# Patient Record
Sex: Female | Born: 1960 | Race: Black or African American | Hispanic: No | Marital: Single | Smoking: Never smoker
Health system: Southern US, Community
[De-identification: ages and names within clinical notes are randomized; demographics above are authoritative.]

## PROBLEM LIST (undated history)

## (undated) DIAGNOSIS — T7840XA Allergy, unspecified, initial encounter: Secondary | ICD-10-CM

## (undated) DIAGNOSIS — F419 Anxiety disorder, unspecified: Secondary | ICD-10-CM

## (undated) DIAGNOSIS — R51 Headache: Secondary | ICD-10-CM

## (undated) HISTORY — PX: WRIST SURGERY: SHX841

## (undated) HISTORY — DX: Allergy, unspecified, initial encounter: T78.40XA

## (undated) HISTORY — PX: ABDOMINAL HYSTERECTOMY: SHX81

## (undated) HISTORY — DX: Anxiety disorder, unspecified: F41.9

## (undated) HISTORY — DX: Headache: R51

---

## 1998-10-27 ENCOUNTER — Other Ambulatory Visit: Admission: RE | Admit: 1998-10-27 | Discharge: 1998-10-27 | Payer: Self-pay | Admitting: Obstetrics and Gynecology

## 1999-02-14 ENCOUNTER — Other Ambulatory Visit: Admission: RE | Admit: 1999-02-14 | Discharge: 1999-02-14 | Payer: Self-pay | Admitting: Obstetrics and Gynecology

## 1999-09-03 ENCOUNTER — Other Ambulatory Visit: Admission: RE | Admit: 1999-09-03 | Discharge: 1999-09-03 | Payer: Self-pay | Admitting: Obstetrics and Gynecology

## 1999-11-12 ENCOUNTER — Other Ambulatory Visit: Admission: RE | Admit: 1999-11-12 | Discharge: 1999-11-12 | Payer: Self-pay | Admitting: Obstetrics and Gynecology

## 1999-11-26 ENCOUNTER — Ambulatory Visit (HOSPITAL_COMMUNITY): Admission: RE | Admit: 1999-11-26 | Discharge: 1999-11-26 | Payer: Self-pay | Admitting: General Surgery

## 2000-12-13 ENCOUNTER — Other Ambulatory Visit: Admission: RE | Admit: 2000-12-13 | Discharge: 2000-12-13 | Payer: Self-pay | Admitting: Obstetrics and Gynecology

## 2001-05-08 ENCOUNTER — Encounter (INDEPENDENT_AMBULATORY_CARE_PROVIDER_SITE_OTHER): Payer: Self-pay | Admitting: Specialist

## 2001-05-08 ENCOUNTER — Ambulatory Visit (HOSPITAL_COMMUNITY): Admission: RE | Admit: 2001-05-08 | Discharge: 2001-05-08 | Payer: Self-pay | Admitting: Obstetrics and Gynecology

## 2001-06-06 HISTORY — PX: ABDOMINAL HYSTERECTOMY: SHX81

## 2001-11-08 ENCOUNTER — Inpatient Hospital Stay (HOSPITAL_COMMUNITY): Admission: RE | Admit: 2001-11-08 | Discharge: 2001-11-11 | Payer: Self-pay | Admitting: Obstetrics and Gynecology

## 2001-11-08 ENCOUNTER — Encounter (INDEPENDENT_AMBULATORY_CARE_PROVIDER_SITE_OTHER): Payer: Self-pay

## 2003-09-19 ENCOUNTER — Encounter: Admission: RE | Admit: 2003-09-19 | Discharge: 2003-09-19 | Payer: Self-pay | Admitting: Family Medicine

## 2004-01-20 ENCOUNTER — Ambulatory Visit (HOSPITAL_BASED_OUTPATIENT_CLINIC_OR_DEPARTMENT_OTHER): Admission: RE | Admit: 2004-01-20 | Discharge: 2004-01-20 | Payer: Self-pay | Admitting: Orthopaedic Surgery

## 2004-08-16 ENCOUNTER — Ambulatory Visit: Payer: Self-pay | Admitting: Family Medicine

## 2004-08-18 ENCOUNTER — Encounter: Admission: RE | Admit: 2004-08-18 | Discharge: 2004-08-18 | Payer: Self-pay | Admitting: Family Medicine

## 2004-08-23 ENCOUNTER — Ambulatory Visit: Payer: Self-pay | Admitting: Family Medicine

## 2004-08-25 ENCOUNTER — Ambulatory Visit (HOSPITAL_COMMUNITY): Admission: RE | Admit: 2004-08-25 | Discharge: 2004-08-25 | Payer: Self-pay | Admitting: Family Medicine

## 2004-09-22 ENCOUNTER — Ambulatory Visit: Payer: Self-pay | Admitting: Family Medicine

## 2004-10-14 ENCOUNTER — Ambulatory Visit: Payer: Self-pay | Admitting: Family Medicine

## 2005-01-24 ENCOUNTER — Ambulatory Visit: Payer: Self-pay | Admitting: Family Medicine

## 2005-01-28 ENCOUNTER — Ambulatory Visit: Payer: Self-pay | Admitting: Internal Medicine

## 2005-05-03 ENCOUNTER — Ambulatory Visit: Payer: Self-pay | Admitting: Family Medicine

## 2005-07-06 ENCOUNTER — Ambulatory Visit: Payer: Self-pay | Admitting: Family Medicine

## 2005-07-25 ENCOUNTER — Ambulatory Visit: Payer: Self-pay | Admitting: Family Medicine

## 2005-08-01 ENCOUNTER — Ambulatory Visit: Payer: Self-pay | Admitting: Family Medicine

## 2005-09-20 ENCOUNTER — Ambulatory Visit: Payer: Self-pay | Admitting: Family Medicine

## 2006-02-07 ENCOUNTER — Ambulatory Visit: Payer: Self-pay | Admitting: Internal Medicine

## 2006-04-02 ENCOUNTER — Emergency Department (HOSPITAL_COMMUNITY): Admission: EM | Admit: 2006-04-02 | Discharge: 2006-04-02 | Payer: Self-pay | Admitting: Emergency Medicine

## 2006-09-13 ENCOUNTER — Ambulatory Visit: Payer: Self-pay | Admitting: Family Medicine

## 2006-09-13 ENCOUNTER — Ambulatory Visit: Payer: Self-pay | Admitting: Internal Medicine

## 2006-09-18 ENCOUNTER — Ambulatory Visit: Payer: Self-pay | Admitting: Family Medicine

## 2006-10-19 ENCOUNTER — Ambulatory Visit: Payer: Self-pay | Admitting: Family Medicine

## 2006-10-19 LAB — CONVERTED CEMR LAB
ALT: 20 units/L (ref 0–40)
AST: 19 units/L (ref 0–37)
Albumin: 3.5 g/dL (ref 3.5–5.2)
Basophils Absolute: 0 10*3/uL (ref 0.0–0.1)
Basophils Relative: 1 % (ref 0.0–1.0)
Bilirubin, Direct: 0.1 mg/dL (ref 0.0–0.3)
Eosinophils Absolute: 0.1 10*3/uL (ref 0.0–0.6)
Glucose, Bld: 91 mg/dL (ref 70–99)
HCT: 36.1 % (ref 36.0–46.0)
Hemoglobin: 12.3 g/dL (ref 12.0–15.0)
MCHC: 34 g/dL (ref 30.0–36.0)
MCV: 90.4 fL (ref 78.0–100.0)
Neutro Abs: 1.7 10*3/uL (ref 1.4–7.7)
Neutrophils Relative %: 39.6 % — ABNORMAL LOW (ref 43.0–77.0)
Platelets: 245 10*3/uL (ref 150–400)
Potassium: 4.2 meq/L (ref 3.5–5.1)
RBC: 3.99 M/uL (ref 3.87–5.11)
Total Protein: 7 g/dL (ref 6.0–8.3)

## 2006-10-26 ENCOUNTER — Ambulatory Visit: Payer: Self-pay | Admitting: Family Medicine

## 2006-11-01 ENCOUNTER — Encounter: Admission: RE | Admit: 2006-11-01 | Discharge: 2006-11-01 | Payer: Self-pay | Admitting: Family Medicine

## 2007-01-25 ENCOUNTER — Encounter (INDEPENDENT_AMBULATORY_CARE_PROVIDER_SITE_OTHER): Payer: Self-pay | Admitting: *Deleted

## 2007-01-25 ENCOUNTER — Telehealth: Payer: Self-pay | Admitting: Family Medicine

## 2007-05-30 ENCOUNTER — Ambulatory Visit: Payer: Self-pay | Admitting: Internal Medicine

## 2007-05-30 ENCOUNTER — Telehealth: Payer: Self-pay | Admitting: Internal Medicine

## 2007-06-01 ENCOUNTER — Telehealth: Payer: Self-pay | Admitting: Family Medicine

## 2007-06-04 ENCOUNTER — Telehealth: Payer: Self-pay | Admitting: Family Medicine

## 2007-06-04 ENCOUNTER — Ambulatory Visit: Payer: Self-pay | Admitting: Family Medicine

## 2007-09-08 ENCOUNTER — Ambulatory Visit: Payer: Self-pay | Admitting: Family Medicine

## 2007-09-10 ENCOUNTER — Ambulatory Visit: Payer: Self-pay | Admitting: Family Medicine

## 2007-10-26 ENCOUNTER — Ambulatory Visit: Payer: Self-pay | Admitting: Family Medicine

## 2007-10-26 LAB — CONVERTED CEMR LAB
ALT: 15 units/L (ref 0–35)
AST: 20 units/L (ref 0–37)
Alkaline Phosphatase: 54 units/L (ref 39–117)
Basophils Absolute: 0 10*3/uL (ref 0.0–0.1)
Basophils Relative: 0.7 % (ref 0.0–1.0)
Bilirubin, Direct: 0.1 mg/dL (ref 0.0–0.3)
Calcium: 9.2 mg/dL (ref 8.4–10.5)
Eosinophils Relative: 1.7 % (ref 0.0–5.0)
Glucose, Bld: 100 mg/dL — ABNORMAL HIGH (ref 70–99)
HCT: 39 % (ref 36.0–46.0)
HDL: 42.9 mg/dL (ref >39.0)
MCHC: 32.4 g/dL (ref 30.0–36.0)
MCV: 92.1 fL (ref 78.0–100.0)
Monocytes Absolute: 0.4 10*3/uL (ref 0.1–1.0)
Monocytes Relative: 9.5 % (ref 3.0–12.0)
Neutro Abs: 1.5 10*3/uL (ref 1.4–7.7)
Triglycerides: 74 mg/dL (ref 0–149)
VLDL: 15 mg/dL (ref 0–40)

## 2007-10-31 ENCOUNTER — Telehealth (INDEPENDENT_AMBULATORY_CARE_PROVIDER_SITE_OTHER): Payer: Self-pay | Admitting: *Deleted

## 2007-10-31 ENCOUNTER — Ambulatory Visit: Payer: Self-pay | Admitting: Family Medicine

## 2007-10-31 DIAGNOSIS — R519 Headache, unspecified: Secondary | ICD-10-CM | POA: Insufficient documentation

## 2007-10-31 DIAGNOSIS — R51 Headache: Secondary | ICD-10-CM | POA: Insufficient documentation

## 2007-11-13 ENCOUNTER — Ambulatory Visit: Payer: Self-pay | Admitting: Internal Medicine

## 2007-11-27 ENCOUNTER — Ambulatory Visit: Payer: Self-pay | Admitting: Internal Medicine

## 2007-11-27 LAB — HM COLONOSCOPY

## 2007-11-29 ENCOUNTER — Encounter: Admission: RE | Admit: 2007-11-29 | Discharge: 2007-11-29 | Payer: Self-pay | Admitting: Family Medicine

## 2008-01-01 ENCOUNTER — Ambulatory Visit: Payer: Self-pay | Admitting: Internal Medicine

## 2008-01-01 ENCOUNTER — Telehealth (INDEPENDENT_AMBULATORY_CARE_PROVIDER_SITE_OTHER): Payer: Self-pay | Admitting: *Deleted

## 2008-01-17 ENCOUNTER — Telehealth: Payer: Self-pay | Admitting: Family Medicine

## 2008-02-15 DIAGNOSIS — G44009 Cluster headache syndrome, unspecified, not intractable: Secondary | ICD-10-CM | POA: Insufficient documentation

## 2008-02-18 ENCOUNTER — Ambulatory Visit: Payer: Self-pay | Admitting: Family Medicine

## 2008-02-28 ENCOUNTER — Telehealth: Payer: Self-pay | Admitting: Family Medicine

## 2008-03-05 ENCOUNTER — Emergency Department (HOSPITAL_COMMUNITY): Admission: EM | Admit: 2008-03-05 | Discharge: 2008-03-06 | Payer: Self-pay | Admitting: Emergency Medicine

## 2008-03-06 ENCOUNTER — Ambulatory Visit: Payer: Self-pay | Admitting: Family Medicine

## 2008-05-06 ENCOUNTER — Ambulatory Visit: Payer: Self-pay | Admitting: Family Medicine

## 2008-05-22 ENCOUNTER — Telehealth: Payer: Self-pay | Admitting: Family Medicine

## 2008-08-25 ENCOUNTER — Telehealth: Payer: Self-pay | Admitting: Family Medicine

## 2008-09-15 ENCOUNTER — Telehealth: Payer: Self-pay | Admitting: Family Medicine

## 2008-09-15 ENCOUNTER — Ambulatory Visit: Payer: Self-pay | Admitting: Family Medicine

## 2008-10-28 ENCOUNTER — Encounter: Payer: Self-pay | Admitting: Family Medicine

## 2008-10-28 ENCOUNTER — Telehealth: Payer: Self-pay | Admitting: Family Medicine

## 2008-10-29 IMAGING — MG MM SCREEN MAMMOGRAM BILATERAL
5 series · 5 of 5 positions shown · non-contrast
Comparison: none

DG SCREEN MAMMOGRAM BILATERAL
Bilateral CC and MLO view(s) were taken.

DIGITAL SCREENING MAMMOGRAM WITH CAD:
There are scattered fibroglandular densities.  No masses or malignant type calcifications are 
identified.  Compared with prior studies.

[R CC]
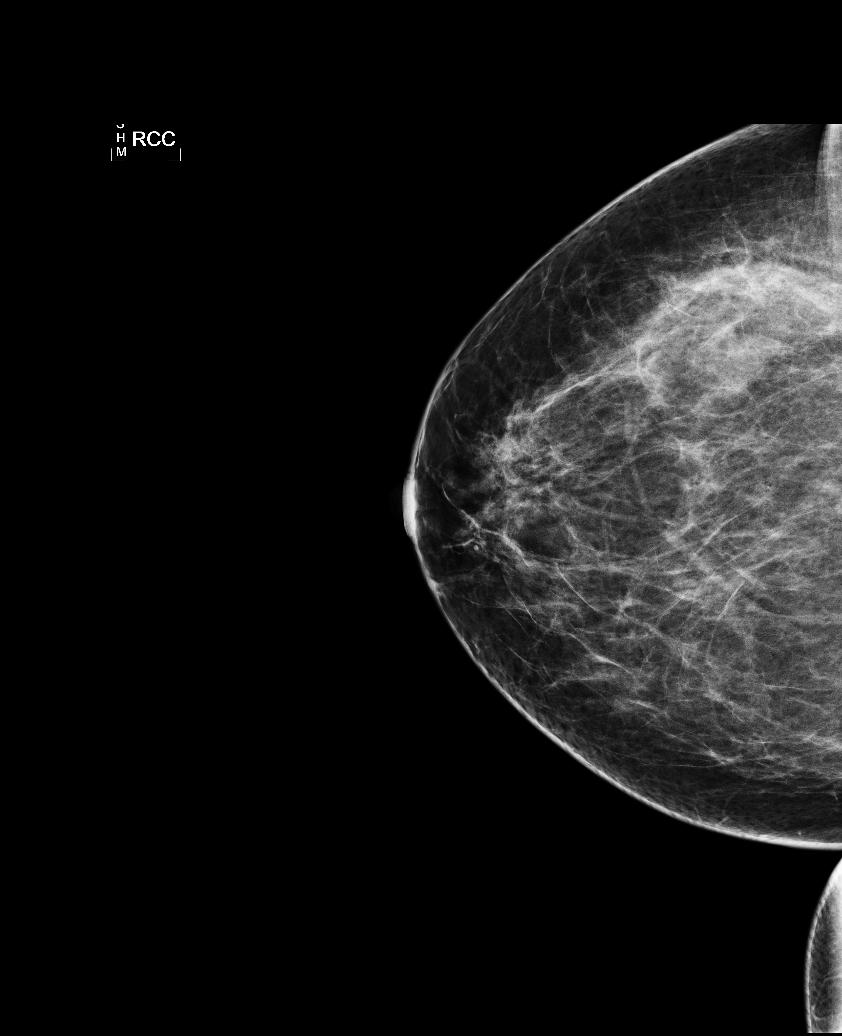

[L CC (1 of 2)]
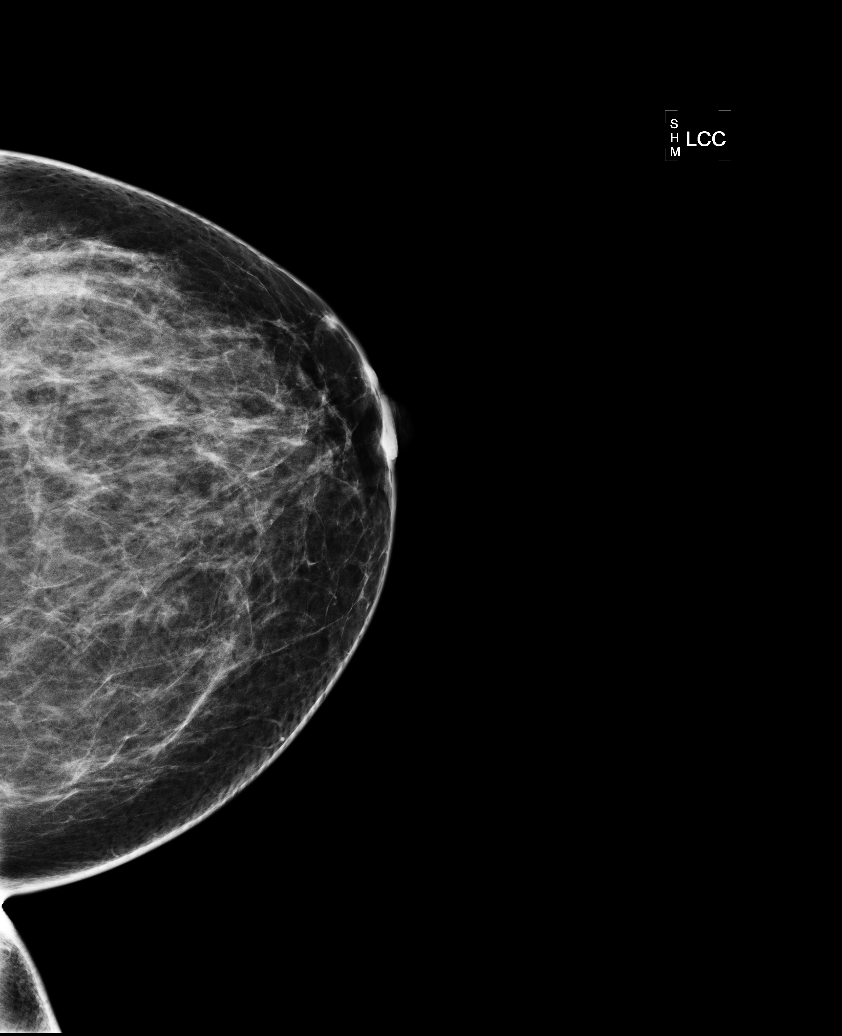

[L MLO]
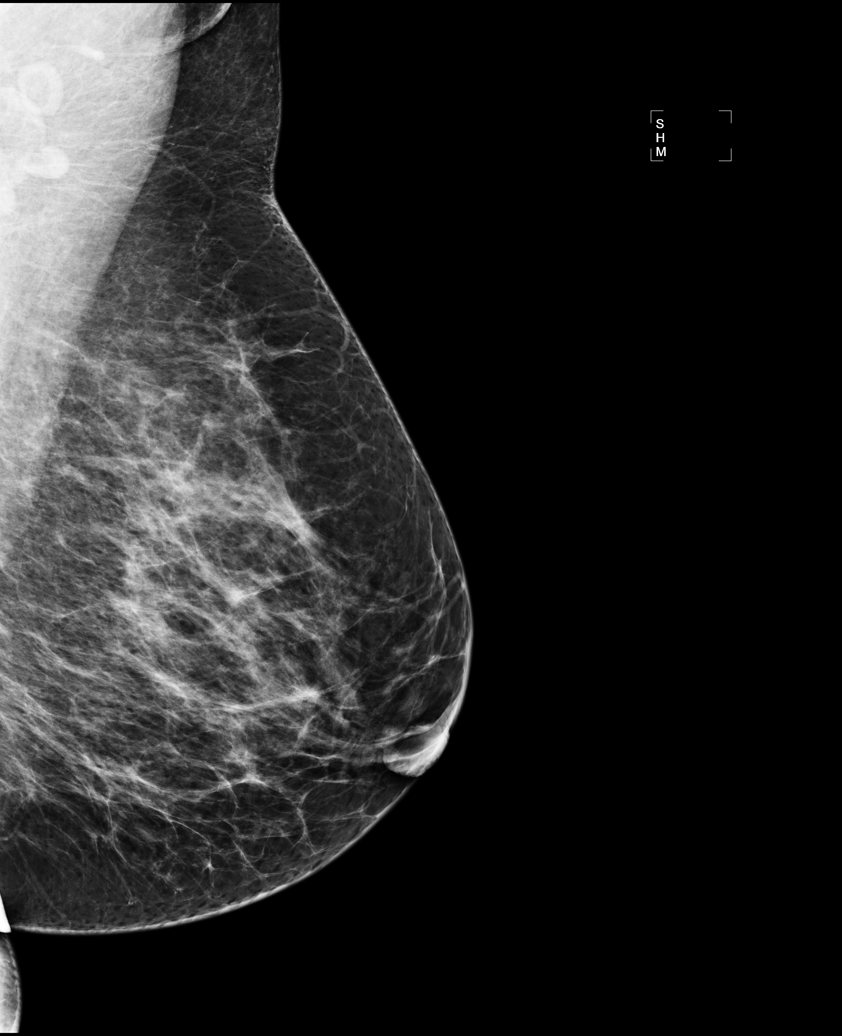

[R MLO]
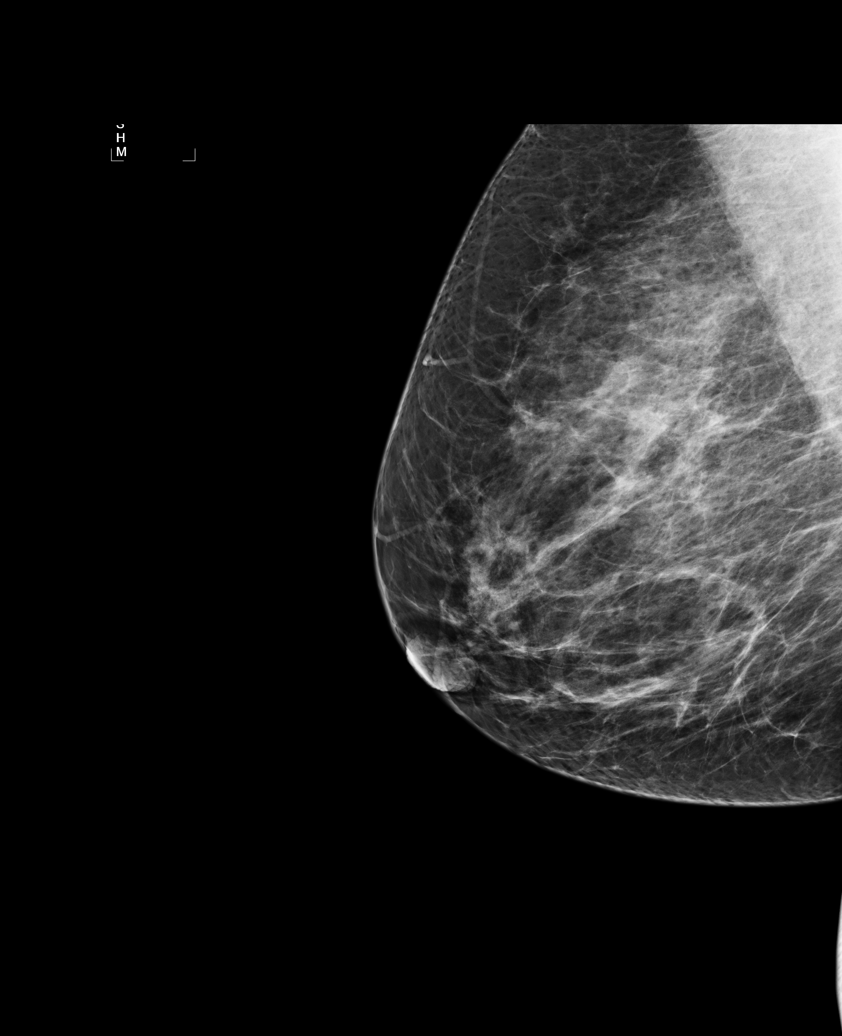

[L CC (2 of 2)]
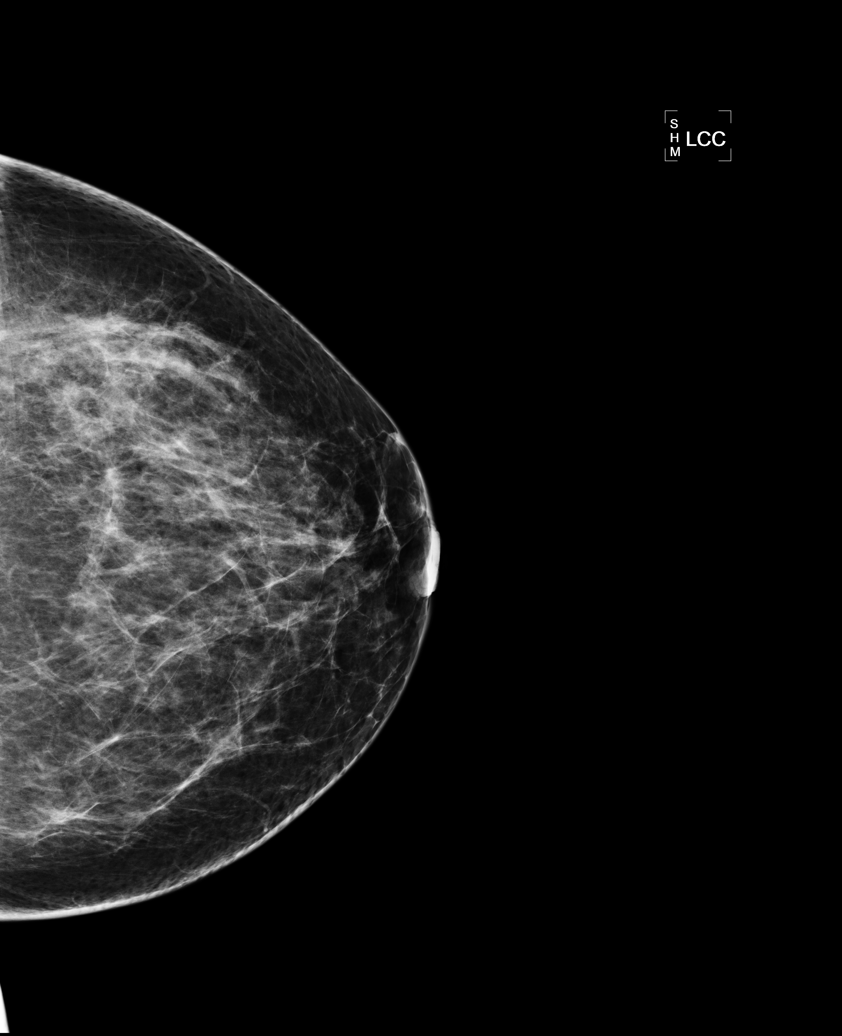

[5 of 5 positions shown; findings below may reference images not displayed]

IMPRESSION: No specific mammographic evidence of malignancy.  Next screening mammogram is recommended in one 
year.

ASSESSMENT: Negative - BI-RADS 1

Screening mammogram in 1 year.
ANALYZED BY COMPUTER AIDED DETECTION. , THIS PROCEDURE WAS A DIGITAL MAMMOGRAM.

## 2008-12-12 ENCOUNTER — Ambulatory Visit: Payer: Self-pay | Admitting: Internal Medicine

## 2008-12-29 ENCOUNTER — Encounter: Payer: Self-pay | Admitting: Family Medicine

## 2008-12-29 ENCOUNTER — Telehealth: Payer: Self-pay | Admitting: *Deleted

## 2009-01-25 ENCOUNTER — Emergency Department (HOSPITAL_COMMUNITY): Admission: EM | Admit: 2009-01-25 | Discharge: 2009-01-25 | Payer: Self-pay | Admitting: Family Medicine

## 2009-02-04 ENCOUNTER — Ambulatory Visit (HOSPITAL_BASED_OUTPATIENT_CLINIC_OR_DEPARTMENT_OTHER): Admission: RE | Admit: 2009-02-04 | Discharge: 2009-02-04 | Payer: Self-pay | Admitting: Plastic Surgery

## 2009-05-11 ENCOUNTER — Ambulatory Visit: Payer: Self-pay | Admitting: Family Medicine

## 2009-05-11 ENCOUNTER — Telehealth: Payer: Self-pay | Admitting: Family Medicine

## 2009-05-11 LAB — CONVERTED CEMR LAB
ALT: 17 units/L (ref 0–35)
AST: 19 units/L (ref 0–37)
Albumin: 3.9 g/dL (ref 3.5–5.2)
Alkaline Phosphatase: 68 units/L (ref 39–117)
Calcium: 9.4 mg/dL (ref 8.4–10.5)
Chloride: 105 meq/L (ref 96–112)
Cholesterol: 177 mg/dL (ref 0–200)
Eosinophils Absolute: 0 10*3/uL (ref 0.0–0.7)
GFR calc non Af Amer: 114.46 mL/min (ref >60)
Glucose, Bld: 78 mg/dL (ref 70–99)
HCT: 38.9 % (ref 36.0–46.0)
LDL Cholesterol: 103 mg/dL — ABNORMAL HIGH (ref 0–99)
MCHC: 33.3 g/dL (ref 30.0–36.0)
MCV: 94.2 fL (ref 78.0–100.0)
Neutro Abs: 2.1 10*3/uL (ref 1.4–7.7)
RBC: 4.13 M/uL (ref 3.87–5.11)
RDW: 12 % (ref 11.5–14.6)
TSH: 0.63 microintl units/mL (ref 0.35–5.50)
Total Bilirubin: 0.4 mg/dL (ref 0.3–1.2)
Total CHOL/HDL Ratio: 4
Total Protein: 7.3 g/dL (ref 6.0–8.3)

## 2009-05-28 ENCOUNTER — Ambulatory Visit: Payer: Self-pay | Admitting: Family Medicine

## 2009-05-28 DIAGNOSIS — N63 Unspecified lump in unspecified breast: Secondary | ICD-10-CM | POA: Insufficient documentation

## 2009-06-02 ENCOUNTER — Encounter: Admission: RE | Admit: 2009-06-02 | Discharge: 2009-06-02 | Payer: Self-pay | Admitting: Family Medicine

## 2009-06-24 ENCOUNTER — Telehealth: Payer: Self-pay | Admitting: Family Medicine

## 2009-07-23 ENCOUNTER — Ambulatory Visit (HOSPITAL_COMMUNITY): Admission: RE | Admit: 2009-07-23 | Discharge: 2009-07-23 | Payer: Self-pay | Admitting: Plastic Surgery

## 2009-11-04 ENCOUNTER — Telehealth: Payer: Self-pay | Admitting: Family Medicine

## 2010-03-12 ENCOUNTER — Telehealth: Payer: Self-pay | Admitting: Family Medicine

## 2010-04-07 ENCOUNTER — Ambulatory Visit: Payer: Self-pay | Admitting: Family Medicine

## 2010-06-27 ENCOUNTER — Encounter: Payer: Self-pay | Admitting: Internal Medicine

## 2010-07-06 NOTE — Progress Notes (Signed)
Summary: hydrocodone refill  Phone Note From Pharmacy   Summary of Call: patient requests a refill of hydrocodone. last office visit was 05/2009 is this okay to fill? Initial call taken by: Kern Reap CMA Duncan Dull),  November 04, 2009 11:43 AM  Follow-up for Phone Call        Vicodin ES dispense 20 tablets directions, one every 4 to 6 hours as needed for migraine headache, refills x 2 Follow-up by: Roderick Pee MD,  November 04, 2009 12:00 PM  Additional Follow-up for Phone Call Additional follow up Details #1::        Pharmacist called Additional Follow-up by: Kern Reap CMA Duncan Dull),  November 04, 2009 2:13 PM

## 2010-07-06 NOTE — Progress Notes (Signed)
Summary: RX for URI  Phone Note Call from Patient   Caller: Patient Call For: Roderick Pee MD Summary of Call: Pt. is asking for RX for coughing, sneezing, chills, no fever and URI symptoms.  Also, has headache. [facility]) [phone] Initial call taken by: Lynann Beaver CMA,  June 24, 2009 10:59 AM  Follow-up for Phone Call        Hydromet dispensed 8 ounces directions one to 2 teaspoons nightly p.r.n. cough, cold, refills x 1 Follow-up by: Roderick Pee MD,  June 24, 2009 11:46 AM    New/Updated Medications: HYDROMET 5-1.5 MG/5ML SYRP (HYDROCODONE-HOMATROPINE) one - two teaspoons q hs as needed cough and cold. Prescriptions: HYDROMET 5-1.5 MG/5ML SYRP (HYDROCODONE-HOMATROPINE) one - two teaspoons q hs as needed cough and cold.  #8 oz. x 1   Entered by:   Lynann Beaver CMA   Authorized by:   Roderick Pee MD   Signed by:   Lynann Beaver CMA on 06/24/2009   Method used:   Telephoned to ...       [facility] (retail)       [address]       [place], [place]  [id number]       Ph: [phone]       Fax: [phone]   RxID:   [id number]

## 2010-07-06 NOTE — Progress Notes (Signed)
Summary: Requesting Imitrex  Phone Note Call from Patient   Summary of Call: [facility] is requesting her med be changed from Zomig to Imitrex due Zomig is to expensive to buy. Please advise...Marland KitchenMarland KitchenMarland Kitchen Return her call:  [phone] Initial call taken by: Kathrynn Speed CMA,  March 12, 2010 1:15 PM  Follow-up for Phone Call        Imitrex 50 mg, dispense 6, use as directed for migraine headache, refills x 3 Follow-up by: Roderick Pee MD,  March 15, 2010 8:34 AM    New/Updated Medications: IMITREX 50 MG TABS (SUMATRIPTAN SUCCINATE) as directed Prescriptions: IMITREX 50 MG TABS (SUMATRIPTAN SUCCINATE) as directed  #6 x 3   Entered by:   Lynann Beaver CMA   Authorized by:   Roderick Pee MD   Signed by:   Lynann Beaver CMA on 03/15/2010   Method used:   Electronically to        [facility] (retail)       [address]       [place], [place]  [id number]       Ph: [phone]       Fax: [phone]   RxID:   [id number]

## 2010-07-06 NOTE — Assessment & Plan Note (Signed)
Summary: ear pain/dm   Vital Signs:  Patient profile:   50 year old female Menstrual status:  hysterectomy O2 Sat:      99 % Temp:     98.5 degrees F Pulse rate:   82 / minute BP sitting:   120 / 76  (left arm) Cuff size:   large  Vitals Entered By: Pura Spice, RN (April 07, 2010 2:14 PM) CC: rt ear pain since this am.   History of Present Illness: Here for 2 problems. First for 2 days she has had pain in the right ear with no other URI symptoms. Also last night her first ever fever blister broke out. Using Blistex on it.   Allergies (verified): No Known Drug Allergies  Past History:  Past Medical History: Reviewed history from 03/06/2008 and no changes required. Headache childbirth x 1 hysterectomy 03, for nonmalignant reasons.  Ovaries were left intact Anxiety  Review of Systems  The patient denies anorexia, fever, weight loss, weight gain, vision loss, decreased hearing, hoarseness, chest pain, syncope, dyspnea on exertion, peripheral edema, prolonged cough, headaches, hemoptysis, abdominal pain, melena, hematochezia, severe indigestion/heartburn, hematuria, incontinence, genital sores, muscle weakness, suspicious skin lesions, transient blindness, difficulty walking, depression, unusual weight change, abnormal bleeding, enlarged lymph nodes, angioedema, breast masses, and testicular masses.    Physical Exam  General:  Well-developed,well-nourished,in no acute distress; alert,appropriate and cooperative throughout examination Head:  Normocephalic and atraumatic without obvious abnormalities. No apparent alopecia or balding. Eyes:  No corneal or conjunctival inflammation noted. EOMI. Perrla. Funduscopic exam benign, without hemorrhages, exudates or papilledema. Vision grossly normal. Ears:  the right external canal is red and tender. The TM is clear. Left ear is clear Nose:  External nasal examination shows no deformity or inflammation. Nasal mucosa are pink and  moist without lesions or exudates. Mouth:  Oral mucosa and oropharynx without lesions or exudates.  Teeth in good repair. Neck:  No deformities, masses, or tenderness noted. Lungs:  Normal respiratory effort, chest expands symmetrically. Lungs are clear to auscultation, no crackles or wheezes. Skin:  there is a cluster of red vessicles on the lower lip at the left corner of mouth Cervical Nodes:  No lymphadenopathy noted   Impression & Recommendations:  Problem # 1:  OTITIS EXTERNA (ICD-380.10)  Her updated medication list for this problem includes:    Ciprodex 0.3-0.1 % Susp (Ciprofloxacin-dexamethasone) .Marland Kitchen... Apply 5 drops in ear q 6 hours as needed  Problem # 2:  FEVER BLISTER (ICD-054.9)  Complete Medication List: 1)  Corgard 40 Mg Tabs (Nadolol) .... Take 1 tablet by mouth once a day 2)  Promethazine Hcl 25 Mg Tabs (Promethazine hcl) .... Prn 3)  Vicodin Es 7.5-750 Mg Tabs (Hydrocodone-acetaminophen) .... Prn 4)  Ativan 0.5 Mg Tabs (Lorazepam) .... Three times a day as needed 5)  Celexa 20 Mg Tabs (Citalopram hydrobromide) .Marland Kitchen.. 1 tab @ bedtime 6)  Prednisone 20 Mg Tabs (Prednisone) .... Use as directed by your doctor 7)  Hydromet 5-1.5 Mg/85ml Syrp (Hydrocodone-homatropine) .... One - two teaspoons q hs as needed cough and cold. 8)  Imitrex 50 Mg Tabs (Sumatriptan succinate) .... As directed 9)  Valtrex 500 Mg Tabs (Valacyclovir hcl) .... Two times a day 10)  Ciprodex 0.3-0.1 % Susp (Ciprofloxacin-dexamethasone) .... Apply 5 drops in ear q 6 hours as needed  Patient Instructions: 1)  Please schedule a follow-up appointment as needed .  Prescriptions: CIPRODEX 0.3-0.1 % SUSP (CIPROFLOXACIN-DEXAMETHASONE) apply 5 drops in ear q 6 hours as  needed  #10 x 0   Entered and Authorized by:   Nelwyn Salisbury MD   Signed by:   Nelwyn Salisbury MD on 04/07/2010   Method used:   Electronically to        [facility] (retail)       [address]       [place],  [place]  [id number]       Ph: [phone]       Fax: [phone]   RxID:   [id number] VALTREX 500 MG TABS (VALACYCLOVIR HCL) two times a day  #10 x 5   Entered and Authorized by:   Nelwyn Salisbury MD   Signed by:   Nelwyn Salisbury MD on 04/07/2010   Method used:   Electronically to        [facility] (retail)       [address]       [place], [place]  [id number]       Ph: [phone]       Fax: [phone]   RxID:   [id number]    Orders Added: 1)  Est. Patient Level IV [30160]

## 2010-07-09 ENCOUNTER — Other Ambulatory Visit: Payer: Self-pay | Admitting: Family Medicine

## 2010-07-09 NOTE — Telephone Encounter (Signed)
Evelyn Edwards please refill, pam , Vicodin for her migraines

## 2010-07-12 NOTE — Telephone Encounter (Signed)
rx called in

## 2010-07-12 NOTE — Telephone Encounter (Signed)
Evelyn Edwards okay to renew medications dispensed 30, refills x 2

## 2010-07-13 ENCOUNTER — Ambulatory Visit
Admission: RE | Admit: 2010-07-13 | Discharge: 2010-07-13 | Disposition: A | Discharge status: Home / Self Care | Payer: No Typology Code available for payment source | Source: Ambulatory Visit

## 2010-07-13 ENCOUNTER — Other Ambulatory Visit: Payer: Self-pay

## 2010-07-13 ENCOUNTER — Other Ambulatory Visit: Payer: Self-pay | Admitting: Cardiology

## 2010-07-13 DIAGNOSIS — R7611 Nonspecific reaction to tuberculin skin test without active tuberculosis: Secondary | ICD-10-CM

## 2010-07-30 ENCOUNTER — Other Ambulatory Visit: Payer: Self-pay | Admitting: Family Medicine

## 2010-09-09 ENCOUNTER — Other Ambulatory Visit: Payer: Self-pay | Admitting: Family Medicine

## 2010-09-27 ENCOUNTER — Other Ambulatory Visit: Payer: Self-pay | Admitting: Infectious Diseases

## 2010-09-27 DIAGNOSIS — R7611 Nonspecific reaction to tuberculin skin test without active tuberculosis: Secondary | ICD-10-CM

## 2010-10-22 NOTE — Op Note (Signed)
[facility]  Patient:    Evelyn Edwards, Evelyn Edwards Visit Number: [id number] MRN: [id number]          Service Type: GYN Location: 9300 9304 [id number] Attending Physician:  Osborn Coho Dictated by:   Janeece Riggers Dareen Piano, M.D. Proc. Date: 11/08/01 Admit Date:  11/08/2001                             Operative Report  PREOPERATIVE DIAGNOSIS:       Menorrhagia.  Severe dysmenorrhea.  Uterine fibroids.  POSTOPERATIVE DIAGNOSIS:      Menorrhagia.  Severe dysmenorrhea.  Uterine fibroids.  OPERATION:                    Total abdominal hysterectomy.  SURGEON:                      Mark E. Dareen Piano, M.D.  ASSISTANT:                    Gerrit Friends. Aldona Bar, M.D.  ANESTHESIA:                   General endotracheal anesthesia.  ANTIBIOTICS:                  Ancef 1 gram.  ESTIMATED BLOOD LOSS:         200 cc.  COMPLICATIONS:                None.  DRAINS:                       Foley to bedside drainage.  SPECIMENS:                    Cervix and uterus sent to pathology.  DESCRIPTION OF PROCEDURE:     The patient was taken to the operating room where a general anesthesia was administered without complications.  She was then prepped with Hibiclens and a Foley catheter was placed in her bladder. She was draped in the usual fashion for this procedure.  A Pfannenstiel incision was made.  This was carried down to the fascia.  The fascia was entered in the midline and extended laterally with Mayo scissors.  The fascia was then separated from the muscles with the Bovie.  The muscles were divided in the midline and taken inferiorly and superiorly.  The parietoperitoneum was entered bluntly.  Examination of the abdominal contents was then undertaken which all appeared to be normal.  The OConnor-OSullivan retractor was then placed and the bowel packed away with three laps.  The uterus was grasped with two Kellys.  The left round ligament was then ligated with 0 Monocryl  suture and transected with the Bovie.  The anterior and posterior leaves of the broad ligament was then opened.  The ovarian ligament and fallopian tube were isolated, clamped, cut, and ligated x2 with 0 Monocryl suture.  A similar procedure was performed on the opposite side.  A bladder flap was then taken down sharply.  The uterine vessels were then skeletonized, clamped, cut, and ligated with 0 Monocryl suture bilaterally.  Cardinal ligaments were serially clamped, cut, and ligated with 0 Monocryl suture bilaterally.  Once the level of the external os was reached, the vagina was entered and circumscribed with the scissors.  The vaginal angles were closed in Heaney fashion using 0 Monocryl suture.  The remaining vaginal cuff was closed using 0 Monocryl suture in interrupted figure-of-eight fashion.  The pelvis was then irrigated. Hemostasis appeared to be adequate.  The ovary was attached to the remnant of the round ligament with interrupted 0 Monocryl suture bilaterally.  The pelvis was again irrigated and small peritoneal bleeders were made hemostatic with the Bovie.  This concluded the procedure.  The instruments were removed.  The laps removed.  The parietoperitoneum was closed using 2-0 Monocryl in a running fashion.  The muscle was reapproximated in the midline using 2-0 Monocryl in a running fashion.  The fascia was closed using 0 Monocryl suture in a running fashion.  The subcuticular tissue was made hemostatic with the Bovie.  Stainless steel clips were used to close the skin.  The patient tolerated the procedure well and she was taken to the recovery room in stable condition.  Sponge, needle, and instrument counts were correct x2. Dictated by:   Janeece Riggers Dareen Piano, M.D. Attending Physician:  Osborn Coho DD:  11/08/01 TD:  11/10/01 Job: [id number]

## 2010-10-22 NOTE — H&P (Signed)
[facility]  Patient:    Evelyn Edwards, Evelyn Edwards Visit Number: [id number] MRN: [id number]          Service Type: GYN Location: 9300 9399 [id number] Attending Physician:  Osborn Coho Dictated by:   Janeece Riggers Dareen Piano, M.D. Admit Date:  11/08/2001   CC:         Evette Georges, M.D. [facility]   History and Physical  CHIEF COMPLAINT:              Ms. Monterroso is a 50 year old black female, gravida 1 para 1, who presents today for total abdominal hysterectomy secondary to a several year history of worsening dysmenorrhea and menorrhagia.  HISTORY OF PRESENT ILLNESS:   The patient also has several fibroids.  The patient did have D&C with cryoablation in December 2002.  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  Following this procedure the patient continued to have worsening dysmenorrhea and menorrhagia.  The patient got essentially no relief with cryoablation.  The patient was offered attempt at rollerball ablation; however, she declined and wished to proceed with definitive therapy. The patient expressed her understanding of the risks and complications of this procedure.  The patient did have an ultrasound prior to this procedure which revealed multiple fibroids.  Two of the fibroids were 4 x 5 cm, one close to the cervix which was felt to make it a potentially difficult vaginal hysterectomy.  In light of, the patient will undergo a total abdominal hysterectomy.  ALLERGIES:                    The patient has no known medical allergies.  CURRENT MEDICATIONS:          The patient is currently on no medications.  PAST OBSTETRICAL HISTORY:     1. The patient has had one vaginal birth without                                  complications.                               2. D&C.                               3. Cryoablation as mentioned above.  SOCIAL HISTORY:               The patient denies  smoking or alcohol use.  PHYSICAL EXAMINATION:  VITAL SIGNS:                  Weight 180 pounds.  Her vital signs are stable. She is afebrile.  HEENT:                        Examination within normal limits.  LUNGS:                        Clear to auscultation.  CARDIOVASCULAR:               Regular rate and rhythm without murmur.  ABDOMEN:                      Soft, nontender, nondistended.  There are no palpable masses.  There is no organomegaly.  BREAST:                       Without lesions.  No lymphadenopathy.  No masses.  EXTREMITIES:                  Within normal limits.  PELVIC:                       Normal external genitalia.  The vagina is without lesions or discharge.  The cervix is parous.  The uterus is multilobular consistent with fibroids.  Adnexa reveals fibroid; however, no other masses.  IMPRESSION:                   1. Menometrorrhagia.                               2. Dysmenorrhea.                               3. Uterine fibroids.  PLAN:                         Proceed with total abdominal hysterectomy.Dictated by:   Janeece Riggers Dareen Piano, M.D. Attending Physician:  Osborn Coho DD:  11/07/01 TD:  11/07/01 Job: [id number]

## 2010-10-22 NOTE — Op Note (Signed)
NAME:  Evelyn Edwards, Evelyn Edwards                          ACCOUNT NO.:  [id number]   MEDICAL RECORD NO.:  [id number]                   PATIENT TYPE:  AMB   LOCATION:  DSC                                  FACILITY:  [facility]   PHYSICIAN:  Lubertha Basque. Jerl Santos, M.D.             DATE OF BIRTH:  Mar 29, 1961   DATE OF PROCEDURE:  01/20/2004  DATE OF DISCHARGE:                                 OPERATIVE REPORT   PREOPERATIVE DIAGNOSIS:  Right knee chondromalacia of the patella.   POSTOPERATIVE DIAGNOSIS:  Right knee chondromalacia of the patella.   PROCEDURE:  Right knee chondroplasty of the patella.   ANESTHESIA:  General.   ATTENDING SURGEON:  Lubertha Basque. Jerl Santos, M.D.   ASSISTANT:  Lindwood Qua, P.A.   INDICATION FOR PROCEDURE:  The patient is a 50 year old woman we have  followed for several years for right knee pain.  This has persisted despite  oral anti-inflammatories and injections, which have afforded her transient  relief.  She has mechanical locking and pain especially on stairs and  squatting.  She has undergone an MRI scan, which shows chondromalacia of the  patella but intact meniscal structures.  She has continued pain with use and  some rest pain and is offered an arthroscopy.  Informed operative consent  was obtained after discussion of the possible complications of, reaction to  anesthesia, and infection.   DESCRIPTION OF PROCEDURE:  The patient was taken to the operating suite,  where a general anesthetic was applied without difficulty.  She was  positioned supine and prepped and draped in a normal sterile fashion.  After  the administration of preoperative IV antibiotics, an arthroscopy of the  right knee was performed through two inferior portals.  The suprapatellar  pouch was benign, while the patellofemoral joint exhibited grade 3 change on  the apex of the patella.  A thorough chondroplasty was done.  The patella  tracked in a fairly normal position, and no lateral release was  done.  The  medial and lateral compartments were benign with no evidence of meniscal or  articular cartilage injury.  The ACL appeared intact.  The knee was  thoroughly irrigated at the end of the case, followed by placement of  Marcaine with epinephrine and morphine.  Adaptic was placed over the  portals, followed by dry gauze and a loose Ace wrap.  Estimated blood loss  and intraoperative fluids can be obtained from anesthesia records.   DISPOSITION:  The patient was extubated in the operating room and taken to  the recovery room in stable condition.  Plans were for her to home the same  day and follow up in the office in less than a week.  I will contact her by  phone tonight.  Lubertha Basque Jerl Santos, M.D.    PGD/MEDQ  D:  01/20/2004  T:  01/20/2004  Job:  [id number]

## 2010-10-22 NOTE — Assessment & Plan Note (Signed)
[facility]                                   ON-CALL NOTE   NAME:AKINSNeeta, Storey                         MRN:          [id number]  DATE:04/02/2006                            DOB:          [birth year]    TIME RECEIVED:  2:23 p.m.   CALLER:  Same.   TELEPHONE:  [phone]   The patient is describing severe bilateral lower abdominal pain.  It started  last night, it lasted all night, and now has been lasting all day today.  It  seems to be getting worse.  There has been some nausea but no vomiting and  no fever.  The patient tried Gas-X with no relief.  My advice is to go to  the emergency room now.    ______________________________  Tera Mater. Clent Ridges, MD    SAF/MedQ  DD: 04/02/2006  DT: 04/03/2006  Job #: [id number]

## 2010-10-22 NOTE — Discharge Summary (Signed)
[facility]  Patient:    Evelyn Edwards, Evelyn Edwards Visit Number: [id number] MRN: [id number]          Service Type: GYN Location: 9300 9304 01 Attending Physician:  Osborn Coho Dictated by:   Janeece Riggers Dareen Piano, M.D. Admit Date:  11/08/2001 Discharge Date: 11/11/2001                             Discharge Summary  PRINCIPAL DISCHARGE DIAGNOSES:    1. Menorrhagia.                                   2. Symptomatic uterine fibroids.                                   3. Severe dysmenorrhea.  PRINCIPAL PROCEDURE:              Total abdominal hysterectomy.  HOSPITAL COURSE:                  The patient is a 50 year old black female, G1, P50, who presented to [facility] on November 08, 2001 for total abdominal hysterectomy.  Description of the events which led up to this can be found in the dictated history and physical.  The patient underwent a total abdominal hysterectomy on November 08, 2001 without complications.  Estimated blood loss was 200 cc.  A complete description of this can be found in the dictated operative note.  The patients pathology from this procedure revealed uterine leiomyomata and benign endometrial tissue.  The patients preoperative hemoglobin was 13.3, postoperative 11.0.  The patients postoperative course was uncomplicated.  The patient was discharged to home on postoperative day #4.  The patient was sent home with Tylox to take p.r.n.  She was instructed to follow up in the office in 4 weeks.  At the time of discharge the patient had been afebrile, she was ambulating without difficulty and eating a regular diet. Dictated by:   Janeece Riggers Dareen Piano, M.D. Attending Physician:  Osborn Coho DD:  11/30/01 TD:  12/03/01 Job: [id number]

## 2010-10-22 NOTE — Op Note (Signed)
[facility]  Patient:    Evelyn Edwards, Evelyn Edwards Visit Number: [id number] MRN: [id number]          Service Type: DSU Location: [facility] Attending Physician:  Osborn Coho Dictated by:   Janeece Riggers Dareen Piano, M.D. Proc. Date: 05/08/01 Admit Date:  05/08/2001                             Operative Report  PREOPERATIVE DIAGNOSES:       Menorrhagia.  POSTOPERATIVE DIAGNOSES:      Menorrhagia.  PROCEDURE:                    1. Dilatation and curettage.                               2. Cryo ablation of endometrium.  SURGEON:                      Mark E. Dareen Piano, M.D.  ANESTHESIA:                   General.  ANTIBIOTICS:                  Ancef 1 g.  DRAINS:                       None.  SPECIMEN:                     Endometrial curettings sent to pathology.  COMPLICATIONS:                None.  ESTIMATED BLOOD LOSS:         Minimal.  PROCEDURE:                    Patient was taken to the operating room where she was placed in a dorsal supine position.  A general anesthetic was administered without complications.  She was then placed in the dorsal lithotomy position and prepped with Hibiclens.  She was draped in the usual fashion for this procedure.  A sterile speculum was placed in the vagina.  A single tooth tenaculum was applied to the anterior cervical lip.  The cervical os was then dilated to a 23 [place].  The uterus was sounded to 9 cm.  A sharp curettage was then placed and the endometrial lining curetted.  Specimens were then sent to pathology.  At this point the cryo ablation machine was prepared and placed in the right cornua.  A freeze was accomplished for six minutes. This procedure was performed in the left cornua.  Two freezes of six minutes were performed.  At this point the procedure was concluded.  The patient was taken to the recovery room in stable condition.  She will be discharged to home with Darvocet to take p.r.n.  She will  follow up in the office in four weeks. Dictated by:   Janeece Riggers Dareen Piano, M.D. Attending Physician:  Osborn Coho DD:  05/08/01 TD:  05/08/01 Job: [id number]

## 2010-10-22 NOTE — Assessment & Plan Note (Signed)
[facility]                                 ON-CALL NOTE   NAME:AKINSAvonelle, Viveros                         MRN:          [id number]  DATE:08/05/2006                            DOB:          1960-10-10    PHONE NUMBER:  [phone].   The patient was seen in Dr. Nelida Meuse office 2 days ago with migraine  headaches that had been persistent for a week on the left side. She was  given an appointment and told to come in to the Saturday clinic. This  occurred about 11:30 or 40. She was put on the schedule but she left  without being seen.     Neta Mends. Panosh, MD  Electronically Signed    WKP/MedQ  DD: 08/06/2006  DT: 08/06/2006  Job #: [id number]

## 2010-10-22 NOTE — Op Note (Signed)
[facility]  Patient:    Evelyn Edwards, Evelyn Edwards                         MRN: [id number] Proc. Date: 11/26/99 Adm. Date:  [id number] Attending:  Carson Myrtle                           Operative Report  PREOPERATIVE DIAGNOSIS:  Umbilical hernia.  POSTOPERATIVE DIAGNOSIS:  Umbilical hernia.  PROCEDURE:  Repair of umbilical hernia.  SURGEON:  Timothy E. Earlene Plater, M.D.  ANESTHESIA:  Local standby.  HISTORY OF PRESENT ILLNESS:  Ms. Weng is an otherwise healthy 50 year old who developed recently a protruding painful umbilical hernia and after careful discussion, she wishes to have it repaired.  DESCRIPTION OF PROCEDURE:  The patient was brought operating room, placed supine, IV started, sedation given. The abdomen was prepped and draped in the usual fashion. A 0.25% Marcaine with epinephrine was used throughout for local anesthesia. After adequate local anesthesia, an infraumbilical incision made, the skin was trimmed to reduce the redundant skin. The underlying scar tissue from previous laparoscopy was excised. The hernia defect was opened, the omentum not adherent was reduced and the hernia was repaired with inverted #0 PDS suture x 3. This closed the defect nicely. The skin was carefully estimated and using 5-0 monocryl, the umbilical skin was attached to the fascia at its mid point and the skin incision closed with interrupted 5-0 monocryl. Counts correct. She tolerated it well. No bleeding or complications. Dry sterile dressings applied and she was removed to the recovery room.  Written and verbal instructions were given to her and her family and she will be seen and followed as an outpatient. DD:  11/26/99 TD:  11/29/99 Job: [id number]

## 2010-12-21 ENCOUNTER — Other Ambulatory Visit: Payer: Self-pay | Admitting: Family Medicine

## 2011-01-04 ENCOUNTER — Telehealth: Payer: Self-pay | Admitting: Family Medicine

## 2011-01-04 NOTE — Telephone Encounter (Signed)
Pt hasnt seen Dr.Todd in 2 years no longer has insurance. Is currently working for [employer] and is trying to get insurance through them but currently is not insured but would like to come in because she is over due for a check up on her migraines. Would this be ok to schedule? Would she need to come in for a CPX since it has been so long?

## 2011-01-04 NOTE — Telephone Encounter (Signed)
Okay to schedule and please come fasting in case Dr Tawanna Cooler wants labs.. 30 minutes if possible.

## 2011-01-21 ENCOUNTER — Other Ambulatory Visit: Payer: Self-pay | Admitting: Family Medicine

## 2011-03-07 LAB — POCT CARDIAC MARKERS
CKMB, poc: 1.9
Myoglobin, poc: 44.5
Myoglobin, poc: 60

## 2011-03-07 LAB — POCT I-STAT, CHEM 8
BUN: 9
Calcium, Ion: 1.2
HCT: 42
Hemoglobin: 14.3
Sodium: 138
TCO2: 27

## 2011-03-07 LAB — DIFFERENTIAL
Basophils Absolute: 0.1
Basophils Relative: 1
Eosinophils Relative: 0
Lymphocytes Relative: 29
Monocytes Absolute: 0.5

## 2011-03-07 LAB — CBC
HCT: 39.4
MCHC: 33.1
Platelets: 262
RDW: 13.7

## 2011-03-09 ENCOUNTER — Encounter: Payer: Self-pay | Admitting: Family Medicine

## 2011-03-10 ENCOUNTER — Encounter: Payer: Self-pay | Admitting: Family Medicine

## 2011-03-10 ENCOUNTER — Ambulatory Visit (INDEPENDENT_AMBULATORY_CARE_PROVIDER_SITE_OTHER): Payer: Self-pay | Admitting: Family Medicine

## 2011-03-10 DIAGNOSIS — Z0000 Encounter for general adult medical examination without abnormal findings: Secondary | ICD-10-CM

## 2011-03-10 DIAGNOSIS — R51 Headache: Secondary | ICD-10-CM

## 2011-03-10 DIAGNOSIS — F411 Generalized anxiety disorder: Secondary | ICD-10-CM

## 2011-03-10 DIAGNOSIS — Z23 Encounter for immunization: Secondary | ICD-10-CM

## 2011-03-10 LAB — HEPATIC FUNCTION PANEL
AST: 19 U/L (ref 0–37)
Albumin: 3.9 g/dL (ref 3.5–5.2)
Alkaline Phosphatase: 85 U/L (ref 39–117)
Total Protein: 7.6 g/dL (ref 6.0–8.3)

## 2011-03-10 LAB — BASIC METABOLIC PANEL
CO2: 26 mEq/L (ref 19–32)
Calcium: 9.3 mg/dL (ref 8.4–10.5)
Chloride: 106 mEq/L (ref 96–112)
Glucose, Bld: 90 mg/dL (ref 70–99)
Potassium: 4.2 mEq/L (ref 3.5–5.1)
Sodium: 142 mEq/L (ref 135–145)

## 2011-03-10 LAB — POCT URINALYSIS DIPSTICK
Bilirubin, UA: NEGATIVE
Ketones, UA: NEGATIVE
Leukocytes, UA: NEGATIVE
Spec Grav, UA: 1.015

## 2011-03-10 LAB — CBC WITH DIFFERENTIAL/PLATELET
Basophils Absolute: 0 10*3/uL (ref 0.0–0.1)
Basophils Relative: 0.6 % (ref 0.0–3.0)
Eosinophils Absolute: 0 10*3/uL (ref 0.0–0.7)
HCT: 36.9 % (ref 36.0–46.0)
Hemoglobin: 12.1 g/dL (ref 12.0–15.0)
Lymphocytes Relative: 46.9 % — ABNORMAL HIGH (ref 12.0–46.0)
Lymphs Abs: 1.9 10*3/uL (ref 0.7–4.0)
MCHC: 32.8 g/dL (ref 30.0–36.0)
MCV: 93 fl (ref 78.0–100.0)
Neutro Abs: 1.7 10*3/uL (ref 1.4–7.7)
RBC: 3.97 Mil/uL (ref 3.87–5.11)
RDW: 13.7 % (ref 11.5–14.6)

## 2011-03-10 LAB — LIPID PANEL
Cholesterol: 200 mg/dL (ref 0–200)
HDL: 55 mg/dL (ref >39.00)
VLDL: 15.4 mg/dL (ref 0.0–40.0)

## 2011-03-10 LAB — TSH: TSH: 0.49 u[IU]/mL (ref 0.35–5.50)

## 2011-03-10 MED ORDER — SUMATRIPTAN SUCCINATE 50 MG PO TABS: 50.0000 mg | ORAL_TABLET | ORAL | Status: DC | PRN

## 2011-03-10 MED ORDER — CITALOPRAM HYDROBROMIDE 20 MG PO TABS: 20.0000 mg | ORAL_TABLET | Freq: Every day | ORAL | Status: DC

## 2011-03-10 MED ORDER — PROMETHAZINE HCL 25 MG PO TABS
25.0000 mg | ORAL_TABLET | Freq: Three times a day (TID) | ORAL | Status: DC | PRN
Start: 2011-03-10 — End: 2013-08-12

## 2011-03-10 MED ORDER — NADOLOL 40 MG PO TABS: 40.0000 mg | ORAL_TABLET | Freq: Every day | ORAL | Status: DC

## 2011-03-10 NOTE — Progress Notes (Signed)
  Subjective:    Patient ID: Evelyn Edwards, female    DOB: Dec 15, 1960, 50 y.o.   MRN: [id number]  HPI Evelyn Edwards is a 50 year old female, married, nonsmoker, who comes in for general physical examination because a history of mild depression and migraine headaches.  She takes Celexa 20 mg nightly feels well.  To prevent migraines.  She takes Corgard 1 mg daily and p.r.n., Phenergan, and Imitrex.  She has about two migraines per month.  Review of systems negative except for some hot flushes.  She had her uterus removed for nonmalignant reasons.  In 2003.  She now is having some mild hot flashes at age 63.  She does not get routine eye care.  Referred to Dr. Vonna Kotyk, regular dental care, BSE monthly, and a mammography, colonoscopy, normal, tetanus, 2009, seasonal flu shot today.   Review of Systems  Constitutional: Negative.   HENT: Negative.   Eyes: Negative.   Respiratory: Negative.   Cardiovascular: Negative.   Gastrointestinal: Negative.   Genitourinary: Negative.   Musculoskeletal: Negative.   Neurological: Negative.   Hematological: Negative.   Psychiatric/Behavioral: Negative.        Objective:   Physical Exam  Constitutional: She appears well-developed and well-nourished.  HENT:  Head: Normocephalic and atraumatic.  Right Ear: External ear normal.  Left Ear: External ear normal.  Nose: Nose normal.  Mouth/Throat: Oropharynx is clear and moist.  Eyes: EOM are normal. Pupils are equal, round, and reactive to light.  Neck: Normal range of motion. Neck supple. No thyromegaly present.  Cardiovascular: Normal rate, regular rhythm, normal heart sounds and intact distal pulses.  Exam reveals no gallop and no friction rub.   No murmur heard. Pulmonary/Chest: Effort normal and breath sounds normal.  Abdominal: Soft. Bowel sounds are normal. She exhibits no distension and no mass. There is no tenderness. There is no rebound.  Genitourinary: Vagina normal. Guaiac negative stool. No  vaginal discharge found.       Bilateral breast exam normal  Musculoskeletal: Normal range of motion.  Lymphadenopathy:    She has no cervical adenopathy.  Neurological: She is alert. She has normal reflexes. No cranial nerve deficit. She exhibits normal muscle tone. Coordination normal.  Skin: Skin is warm and dry.  Psychiatric: She has a normal mood and affect. Her behavior is normal. Judgment and thought content normal.          Assessment & Plan:  Healthy female.  History of mild depression continue Celexa.  History migraine headaches.  Continue Corgard daily, Phenergan, and Imitrex p.r.n.  Return one year, sooner if any problems

## 2011-03-10 NOTE — Patient Instructions (Signed)
Continue your current medications.  Return in one year, sooner if any problems

## 2011-04-20 ENCOUNTER — Other Ambulatory Visit: Payer: Self-pay | Admitting: Family Medicine

## 2011-05-03 ENCOUNTER — Other Ambulatory Visit: Payer: Self-pay | Admitting: *Deleted

## 2011-05-03 MED ORDER — HYDROCODONE-ACETAMINOPHEN 7.5-750 MG PO TABS: 1.0000 | ORAL_TABLET | Freq: Four times a day (QID) | ORAL | Status: DC | PRN

## 2011-10-05 ENCOUNTER — Encounter: Payer: Self-pay | Admitting: Family Medicine

## 2011-10-05 ENCOUNTER — Ambulatory Visit (INDEPENDENT_AMBULATORY_CARE_PROVIDER_SITE_OTHER): Payer: BC Managed Care – PPO | Admitting: Family Medicine

## 2011-10-05 ENCOUNTER — Telehealth: Payer: Self-pay | Admitting: *Deleted

## 2011-10-05 VITALS — BP 120/84 | Temp 98.3°F | Wt 204.0 lb

## 2011-10-05 DIAGNOSIS — G5601 Carpal tunnel syndrome, right upper limb: Secondary | ICD-10-CM

## 2011-10-05 DIAGNOSIS — G5600 Carpal tunnel syndrome, unspecified upper limb: Secondary | ICD-10-CM

## 2011-10-05 NOTE — Telephone Encounter (Signed)
Pt is having tingling and numbness in one arm, and wants to be seen today before 2 pm with Dr. Tawanna Cooler.  Is that possible???

## 2011-10-05 NOTE — Patient Instructions (Signed)
Wear the splint until the numbness abates if however it does not abate over the next 6-8 weeks call and we will refer you

## 2011-10-05 NOTE — Progress Notes (Signed)
  Subjective:    Patient ID: Evelyn Edwards, female    DOB: 1961/03/16, 51 y.o.   MRN: [id number]  HPI Evelyn Edwards is a 51 year old married female nonsmoker,,,,,,,,,, bus driver by occupation,,,,,,,, right handed,,,,,, who comes in today for evaluation of tingling in her right hand and arm  She states the symptoms started about a week ago. No trauma. 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 Neurologic review of systems otherwise negative   Review of Systems    neurologic review of systems negative Objective:   Physical Exam Well-developed well-nourished female in no acute distress HEENT negative neck was supple no adenopathy neurologic examination upper extremities normal       Assessment & Plan:  Early carpal tunnel syndrome right arm plan splint return when necessary

## 2011-10-05 NOTE — Telephone Encounter (Signed)
Patient coming in today for an office visit 

## 2011-10-31 ENCOUNTER — Other Ambulatory Visit: Payer: Self-pay | Admitting: Family Medicine

## 2011-11-03 ENCOUNTER — Telehealth: Payer: Self-pay | Admitting: Family Medicine

## 2011-11-03 NOTE — Telephone Encounter (Signed)
Pulled from Triage vmail. Pt called at 11:04. States she had 3 refills left on hydrocodone, but thinks they have expired, because pharmacy would not honor them and fill the Rx. Please call pt - she would like new Rx for her 3 refill. Thanks. [facility].

## 2011-11-03 NOTE — Telephone Encounter (Signed)
Fleet Contras please call Pam to straighten out her prescription

## 2011-11-04 MED ORDER — HYDROCODONE-ACETAMINOPHEN 7.5-750 MG PO TABS: 1.0000 | ORAL_TABLET | Freq: Four times a day (QID) | ORAL | Status: DC | PRN

## 2011-11-08 ENCOUNTER — Encounter (HOSPITAL_COMMUNITY): Payer: Self-pay | Admitting: Emergency Medicine

## 2011-11-08 ENCOUNTER — Emergency Department (HOSPITAL_COMMUNITY)
Admission: EM | Admit: 2011-11-08 | Discharge: 2011-11-08 | Disposition: A | Discharge status: Home / Self Care | Payer: BC Managed Care – PPO | Source: Home / Self Care

## 2011-11-08 DIAGNOSIS — W57XXXA Bitten or stung by nonvenomous insect and other nonvenomous arthropods, initial encounter: Secondary | ICD-10-CM

## 2011-11-08 NOTE — Discharge Instructions (Signed)
Thank you for coming in today. I think something stung you.  Please take Benadryl tonight, and use hydrocortisone cream on your arm.  Followup with your primary care doctor if you do not get better. Go to emergency room if you have trouble breathing or you noticed lip swelling. If it becomes very red and painful over the next few days please go to your regular doctor or here.

## 2011-11-08 NOTE — ED Notes (Signed)
Felt burning sensation on inside of left arm.  Patient looked at arm and thought she saw something embedded.  Patient pinched "it" and pulled off arm.  Small red , circular area on inside of left forearm.  Denies itching, burning seems to be improveing

## 2011-11-08 NOTE — ED Provider Notes (Signed)
Evelyn Edwards is a 51 y.o. female who presents to Urgent Care today for blood bite or sting.  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  This happened approximately 2 hours ago.  She notes some itching at the site of the bug bite.  She denies any trouble breathing or lip swelling fevers or chills. She is not allergic to bee stings.    PMH reviewed. Significant for diabetes and hypertension History  Substance Use Topics  . Smoking status: Never Smoker   . Smokeless tobacco: Not on file  . Alcohol Use: No   ROS as above Medications reviewed. No current facility-administered medications for this encounter.   Current Outpatient Prescriptions  Medication Sig Dispense Refill  . citalopram (CELEXA) 20 MG tablet Take 1 tablet (20 mg total) by mouth daily.  100 tablet  3  . HYDROcodone-acetaminophen (VICODIN ES) 7.5-750 MG per tablet Take 1 tablet by mouth every 6 (six) hours as needed for pain.  30 tablet  5  . nadolol (CORGARD) 40 MG tablet Take 1 tablet (40 mg total) by mouth daily.  100 tablet  3  . promethazine (PHENERGAN) 25 MG tablet Take 1 tablet (25 mg total) by mouth every 8 (eight) hours as needed for nausea.  30 tablet  2  . SUMAtriptan (IMITREX) 50 MG tablet Take 1 tablet (50 mg total) by mouth every 2 (two) hours as needed.  10 tablet  6    Exam:  There were no vitals taken for this visit. Gen: Well NAD HEENT: No lip swelling or tongue swelling SKIN: Central area of erythema with surrounding swelling approximately quarter size on the medial aspect of the left elbow.  Nontender easily blanchable.  No results found for this or any previous visit (from the past 24 hour(s)). No results found.  Assessment and Plan: 51 y.o. female with bug sting or bug bites.  Possibly a bee.  Plan to treat conservatively with over-the-counter Benadryl and hydrocortisone. Discussed signs or symptoms of worsening and infection and  systemic signs.  Patient is not immunocompromised therefore think the possibility of infection is remote. Plan to followup if worsened.  Please see discharge instructions     Rodolph Bong, MD 11/08/11 [phone]

## 2011-11-08 NOTE — ED Notes (Signed)
Vitals obtained by CMA student 

## 2011-11-08 NOTE — ED Provider Notes (Signed)
Medical screening examination/treatment/procedure(s) were performed by a resident physician and as supervising physician I was immediately available for consultation/collaboration.  Leslee Home, M.D.   Reuben Likes, MD 11/08/11 2132

## 2011-11-11 ENCOUNTER — Telehealth: Payer: Self-pay | Admitting: Gastroenterology

## 2011-11-11 NOTE — Telephone Encounter (Signed)
Pt was seen in the ER earlier this week for a bug bite, given benadryl and and hydrocortisone cream. Pt states the area is getting worse, red and swelling. I told her to go back to the ER, pt stated she will go back to [facility].

## 2011-11-21 ENCOUNTER — Encounter: Payer: Self-pay | Admitting: Family Medicine

## 2011-11-21 ENCOUNTER — Ambulatory Visit (INDEPENDENT_AMBULATORY_CARE_PROVIDER_SITE_OTHER): Payer: BC Managed Care – PPO | Admitting: Family Medicine

## 2011-11-21 VITALS — BP 130/90 | Temp 98.4°F | Wt 204.0 lb

## 2011-11-21 DIAGNOSIS — J45901 Unspecified asthma with (acute) exacerbation: Secondary | ICD-10-CM

## 2011-11-21 MED ORDER — PREDNISONE 20 MG PO TABS: ORAL_TABLET | ORAL | Status: DC

## 2011-11-21 MED ORDER — HYDROCODONE-HOMATROPINE 5-1.5 MG/5ML PO SYRP: 5.0000 mL | ORAL_SOLUTION | Freq: Three times a day (TID) | ORAL | Status: AC | PRN

## 2011-11-21 NOTE — Patient Instructions (Signed)
Drink lots of water  Take the prednisone as directed  Hydromet 1/2-1 teaspoon 3 times daily as needed for cough  Return when necessary

## 2011-11-21 NOTE — Progress Notes (Signed)
  Subjective:    Patient ID: Evelyn Edwards, female    DOB: 14-Feb-1961, 51 y.o.   MRN: [id number]  HPI Evelyn Edwards is a 51 year old female nonsmoker who comes in with a four-day history of coughing and wheezing  She states she was up in [place] and began coughing and wheezing on Friday. No fever earache etc. She has had a history of allergic rhinitis but no asthma   Review of Systems General and pulmonary review of systems otherwise negative    Objective:   Physical Exam  Well-developed well-nourished female in no acute distress HEENT negative neck was supple no adenopathy lungs are clear except for symmetrical late very mild expiratory wheezing on forced expiration      Assessment & Plan:

## 2011-11-23 ENCOUNTER — Telehealth: Payer: Self-pay | Admitting: Family Medicine

## 2011-11-23 NOTE — Telephone Encounter (Signed)
Caller: Evelyn Edwards/Patient; PCP: Roderick Pee.; CB#: [phone];  Call regarding Possible Pink Eye;  Onset 11/23/11.  Afebrile. Woke up with bilateral eye lashes matted, irritated sensation in eyes and pinkness of sclera.  Eye mucus recurs.  Currently has URI.  Advised to see MD within 24 hrs for new onset of eye redness, gritty feeling with watery or sticky mucus drainage. No appts remain with Dr Tawanna Cooler for 11/23/11.  Info noted and sent to MD and [facility] for call back to pt.  [facility] [place] [phone].

## 2011-11-23 NOTE — Telephone Encounter (Signed)
Patient is aware 

## 2011-11-23 NOTE — Telephone Encounter (Signed)
Fleet Contras please call,,,,,,,,,,,,,,, have her purchase the over-the-counter eyedrops called clear eyes directions 2 drops 4 times daily until redness gone

## 2011-12-05 ENCOUNTER — Other Ambulatory Visit: Payer: Self-pay | Admitting: Family Medicine

## 2011-12-05 DIAGNOSIS — Z1231 Encounter for screening mammogram for malignant neoplasm of breast: Secondary | ICD-10-CM

## 2011-12-19 ENCOUNTER — Ambulatory Visit
Admission: RE | Admit: 2011-12-19 | Discharge: 2011-12-19 | Disposition: A | Discharge status: Home / Self Care | Payer: BC Managed Care – PPO | Source: Ambulatory Visit | Attending: Family Medicine | Admitting: Family Medicine

## 2011-12-19 DIAGNOSIS — Z1231 Encounter for screening mammogram for malignant neoplasm of breast: Secondary | ICD-10-CM

## 2012-03-05 ENCOUNTER — Other Ambulatory Visit: Payer: BC Managed Care – PPO

## 2012-03-06 ENCOUNTER — Other Ambulatory Visit: Payer: BC Managed Care – PPO

## 2012-03-09 ENCOUNTER — Other Ambulatory Visit (INDEPENDENT_AMBULATORY_CARE_PROVIDER_SITE_OTHER): Payer: BC Managed Care – PPO

## 2012-03-09 DIAGNOSIS — Z0000 Encounter for general adult medical examination without abnormal findings: Secondary | ICD-10-CM

## 2012-03-09 LAB — CBC WITH DIFFERENTIAL/PLATELET
Basophils Relative: 0.4 % (ref 0.0–3.0)
Eosinophils Relative: 1.1 % (ref 0.0–5.0)
HCT: 38.4 % (ref 36.0–46.0)
Hemoglobin: 12.4 g/dL (ref 12.0–15.0)
Lymphs Abs: 1.8 10*3/uL (ref 0.7–4.0)
MCHC: 32.4 g/dL (ref 30.0–36.0)
MCV: 92.8 fl (ref 78.0–100.0)
Monocytes Absolute: 0.4 10*3/uL (ref 0.1–1.0)
Neutro Abs: 1.8 10*3/uL (ref 1.4–7.7)
RBC: 4.14 Mil/uL (ref 3.87–5.11)
WBC: 4 10*3/uL — ABNORMAL LOW (ref 4.5–10.5)

## 2012-03-09 LAB — POCT URINALYSIS DIPSTICK
Blood, UA: NEGATIVE
Glucose, UA: NEGATIVE
Leukocytes, UA: NEGATIVE
Nitrite, UA: NEGATIVE
Urobilinogen, UA: 0.2

## 2012-03-09 LAB — HEPATIC FUNCTION PANEL
Bilirubin, Direct: 0 mg/dL (ref 0.0–0.3)
Total Protein: 7.5 g/dL (ref 6.0–8.3)

## 2012-03-09 LAB — BASIC METABOLIC PANEL
CO2: 28 mEq/L (ref 19–32)
Chloride: 104 mEq/L (ref 96–112)
Potassium: 4.1 mEq/L (ref 3.5–5.1)

## 2012-03-09 LAB — LIPID PANEL
LDL Cholesterol: 113 mg/dL — ABNORMAL HIGH (ref 0–99)
Total CHOL/HDL Ratio: 5
Triglycerides: 146 mg/dL (ref 0.0–149.0)

## 2012-03-12 ENCOUNTER — Encounter: Payer: Self-pay | Admitting: Family Medicine

## 2012-03-12 ENCOUNTER — Ambulatory Visit (INDEPENDENT_AMBULATORY_CARE_PROVIDER_SITE_OTHER): Payer: BC Managed Care – PPO | Admitting: Family Medicine

## 2012-03-12 VITALS — BP 120/72 | HR 61 | Temp 98.1°F | Resp 18 | Wt 193.0 lb

## 2012-03-12 DIAGNOSIS — J45901 Unspecified asthma with (acute) exacerbation: Secondary | ICD-10-CM

## 2012-03-12 DIAGNOSIS — F411 Generalized anxiety disorder: Secondary | ICD-10-CM

## 2012-03-12 DIAGNOSIS — Z0000 Encounter for general adult medical examination without abnormal findings: Secondary | ICD-10-CM

## 2012-03-12 DIAGNOSIS — R51 Headache: Secondary | ICD-10-CM

## 2012-03-12 DIAGNOSIS — G44009 Cluster headache syndrome, unspecified, not intractable: Secondary | ICD-10-CM

## 2012-03-12 DIAGNOSIS — N63 Unspecified lump in unspecified breast: Secondary | ICD-10-CM

## 2012-03-12 MED ORDER — SUMATRIPTAN SUCCINATE 50 MG PO TABS: 50.0000 mg | ORAL_TABLET | ORAL | Status: DC | PRN

## 2012-03-12 MED ORDER — NADOLOL 40 MG PO TABS: 40.0000 mg | ORAL_TABLET | Freq: Every day | ORAL | Status: DC

## 2012-03-12 NOTE — Progress Notes (Signed)
  Subjective:    Patient ID: Evelyn Edwards, female    DOB: 07/20/1960, 51 y.o.   MRN: [id number]  HPI Evelyn Edwards is a 51 year old married female nonsmoker who comes in today for general physical examination  She takes Corgard 40 mg daily to prevent migraine headaches and indeed her headaches have decreased in severity and frequency. She takes Phenergan and Imitrex when necessary for breakthrough headaches.  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 Gweneth Dimitri, regular dental care, BSE monthly, and you mammography, colonoscopy and GI, tetanus 2009. She had her uterus removed for nonmalignant reasons.   Review of Systems  Constitutional: Negative.   HENT: Negative.   Eyes: Negative.   Respiratory: Negative.   Cardiovascular: Negative.   Gastrointestinal: Negative.   Genitourinary: Negative.   Musculoskeletal: Negative.   Neurological: Negative.   Hematological: Negative.   Psychiatric/Behavioral: Negative.        Objective:   Physical Exam  Constitutional: She appears well-developed and well-nourished.  HENT:  Head: Normocephalic and atraumatic.  Right Ear: External ear normal.  Left Ear: External ear normal.  Nose: Nose normal.  Mouth/Throat: Oropharynx is clear and moist.  Eyes: EOM are normal. Pupils are equal, round, and reactive to light.  Neck: Normal range of motion. Neck supple. No thyromegaly present.  Cardiovascular: Normal rate, regular rhythm, normal heart sounds and intact distal pulses.  Exam reveals no gallop and no friction rub.   No murmur heard. Pulmonary/Chest: Effort normal and breath sounds normal.  Abdominal: Soft. Bowel sounds are normal. She exhibits no distension and no mass. There is no tenderness. There is no rebound.  Genitourinary: Vagina normal. Guaiac negative stool. No vaginal discharge found.       Bilateral breast exam normal    Musculoskeletal: Normal range of motion.  Lymphadenopathy:    She has no cervical adenopathy.  Neurological: She is alert. She has normal reflexes. No cranial nerve deficit. She exhibits normal muscle tone. Coordination normal.  Skin: Skin is warm and dry.  Psychiatric: She has a normal mood and affect. Her behavior is normal. Judgment and thought content normal.          Assessment & Plan:  Healthy female  Migraine headaches continue Corgard

## 2012-03-12 NOTE — Patient Instructions (Signed)
Continue to take the Corgard daily  Imitrex when necessary for breakthrough migraines  Return in one year sooner if any problems  Remember to do a thorough breast exam monthly and get a mammogram yearly

## 2012-03-14 ENCOUNTER — Telehealth: Payer: Self-pay | Admitting: Family Medicine

## 2012-03-14 NOTE — Telephone Encounter (Signed)
Caller: Celestina/Patient; Patient Name: Evelyn Edwards; PCP: Roderick Pee.; Best Callback Phone Number: [phone]; Onset of  bee sting to upper lip on 03/13/12 at 15:25-and is still swollen-entire top lip and twice the normal size of patient's lip.  It was a yellow jacket per self report-afebrile. Denies emergent symptoms or signs of infection.  Triaged using Bites and Stings-insect with a disposition of home care due to all other situations.  Care advice given with patient demonstrating understanding.  No appointment needed at this time.

## 2012-06-04 ENCOUNTER — Other Ambulatory Visit: Payer: Self-pay | Admitting: Family Medicine

## 2012-06-04 MED ORDER — HYDROCODONE-ACETAMINOPHEN 7.5-325 MG PO TABS: 1.0000 | ORAL_TABLET | Freq: Four times a day (QID) | ORAL | Status: DC | PRN

## 2012-08-07 ENCOUNTER — Telehealth: Payer: Self-pay | Admitting: Family Medicine

## 2012-08-07 ENCOUNTER — Encounter: Payer: Self-pay | Admitting: Family Medicine

## 2012-08-07 ENCOUNTER — Ambulatory Visit (INDEPENDENT_AMBULATORY_CARE_PROVIDER_SITE_OTHER): Payer: BC Managed Care – PPO | Admitting: Family Medicine

## 2012-08-07 VITALS — BP 140/86 | HR 86 | Temp 98.9°F | Wt 186.0 lb

## 2012-08-07 MED ORDER — HYDROCODONE-HOMATROPINE 5-1.5 MG/5ML PO SYRP: 5.0000 mL | ORAL_SOLUTION | Freq: Every evening | ORAL | Status: DC | PRN

## 2012-08-07 NOTE — Patient Instructions (Signed)
INSTRUCTIONS FOR UPPER RESPIRATORY INFECTION:  -plenty of rest and fluids  -nasal saline wash 2-3 times daily (use prepackaged nasal saline or bottled/distilled water if making your own)   -can use sinex nasal spray for drainage and nasal congestion - but do NOT use longer then 3-4 days  -can use tylenol or ibuprofen as directed for aches and sorethroat  -in the winter time, using a humidifier at night is helpful (please follow cleaning instructions)  -if you are taking a cough medication - use only as directed, may also try a teaspoon of honey to coat the throat and throat lozenges  -for sore throat, salt water gargles can help  -follow up if you have fevers, facial pain, tooth pain, difficulty breathing or are worsening or not getting better in 7 days

## 2012-08-07 NOTE — Telephone Encounter (Signed)
Patient Information:  Caller Name: Ginevra  Phone: [phone]  Patient: Evelyn Edwards, Evelyn Edwards  Gender: Female  DOB: July 27, 1960  Age: 52 Years  PCP: Kelle Darting [facility])  Pregnant: No  Office Follow Up:  Does the office need to follow up with this patient?: No  Instructions For The Office: N/A  RN Note:  Called for appointment for URI symptoms  Symptoms  Reason For Call & Symptoms: cold and congestion with minor cough  Reviewed Health History In EMR: Yes  Reviewed Medications In EMR: Yes  Reviewed Allergies In EMR: Yes  Reviewed Surgeries / Procedures: Yes  Date of Onset of Symptoms: 08/03/2012  Treatments Tried: otc cough medicine  Treatments Tried Worked: No OB / GYN:  LMP: Unknown  Guideline(s) Used:  Colds  Disposition Per Guideline:   See Today or Tomorrow in Office  Reason For Disposition Reached:   Patient wants to be seen  Advice Given:  Reassurance  It sounds like an uncomplicated cold that we can treat at home.  Colds are very common and may make you feel uncomfortable.  Colds are caused by viruses, and no medicine or "shot" will cure an uncomplicated cold.  Colds are usually not serious.  Here is some care advice that should help.  For a Runny Nose With Profuse Discharge:   Nasal mucus and discharge helps to wash viruses and bacteria out of the nose and sinuses.  Blowing the nose is all that is needed.  For a Stuffy Nose - Use Nasal Washes:  Introduction: Saline (salt water) nasal irrigation (nasal wash) is an effective and simple home remedy for treating stuffy nose and sinus congestion. The nose can be irrigated by pouring, spraying, or squirting salt water into the nose and then letting it run back out.  Treatment for Associated Symptoms of Colds:  For muscle aches, headaches, or moderate fever (more than 101 F or 38.9 C): Take acetaminophen every 4 hours.  Sore throat: Try throat lozenges, hard candy, or warm chicken broth.  Hydrate: Drink  adequate liquids.  Humidifier:  If the air in your home is dry, use a cool-mist humidifier  Contagiousness:  The cold virus is present in your nasal secretions.  Cover your nose and mouth with a tissue when you sneeze or cough.  Wash your hands frequently with soap and water.  You can return to work or school after the fever is gone and you feel well enough to participate in normal activities.  Expected Course:   Fever may last 2-3 days  Nasal discharge 7-14 days  Cough up to 2-3 weeks.  Call Back If:  Difficulty breathing occurs  Cough lasts more than 3 weeks  You become worse  Appointment Scheduled:  08/07/2012 15:15:00 Appointment Scheduled Provider:  Kriste Basque [facility])

## 2012-08-07 NOTE — Progress Notes (Signed)
Chief Complaint  Patient presents with  . Cough    congestion, chills since Friday     HPI:  Acute visit for cough and congestion: -started about 4 days ago -symptoms: nasal congestion, drainage in throat, cough, chills -denies: fevers, NVD, SOB, CP -tried: OTC decongestant -sick contacts: grandchildren with cold -no hx of lung disease  ROS: See pertinent positives and negatives per HPI.  Past Medical History  Diagnosis Date  . Headache   . Anxiety     Family History  Problem Relation Age of Onset  . Hypertension Father   . Diabetes Father   . Coronary artery disease Father   . Colon polyps Father   . Asthma Mother   . Lupus Sister   . Anxiety disorder      History   Social History  . Marital Status: Single    Spouse Name: N/A    Number of Children: N/A  . Years of Education: N/A   Social History Main Topics  . Smoking status: Never Smoker   . Smokeless tobacco: None  . Alcohol Use: No  . Drug Use: No  . Sexually Active: None   Other Topics Concern  . None   Social History Narrative  . None    Current outpatient prescriptions:HYDROcodone-acetaminophen (NORCO) 7.5-325 MG per tablet, Take 1 tablet by mouth every 6 (six) hours as needed for pain., Disp: 30 tablet, Rfl: 5;  nadolol (CORGARD) 40 MG tablet, Take 1 tablet (40 mg total) by mouth daily., Disp: 100 tablet, Rfl: 3;  promethazine (PHENERGAN) 25 MG tablet, Take 1 tablet (25 mg total) by mouth every 8 (eight) hours as needed for nausea., Disp: 30 tablet, Rfl: 2 SUMAtriptan (IMITREX) 50 MG tablet, Take 1 tablet (50 mg total) by mouth every 2 (two) hours as needed., Disp: 10 tablet, Rfl: 6;  HYDROcodone-homatropine (HYCODAN) 5-1.5 MG/5ML syrup, Take 5 mLs by mouth at bedtime as needed for cough., Disp: 120 mL, Rfl: 0  EXAM:  Filed Vitals:   08/07/12 1514  BP: 140/86  Pulse: 86  Temp: 98.9 F (37.2 C)    Body mass index is 31.18 kg/(m^2).  GENERAL: vitals reviewed and listed above, alert,  oriented, appears well hydrated and in no acute distress  HEENT: atraumatic, conjunttiva clear, no obvious abnormalities on inspection of external nose and ears, normal appearance of ear canals and TMs, clear nasal congestion, mild post oropharyngeal erythema with PND, no tonsillar edema or exudate, no sinus TTP  NECK: no obvious masses on inspection  LUNGS: clear to auscultation bilaterally, no wheezes, rales or rhonchi, good air movement  CV: HRRR, no peripheral edema  MS: moves all extremities without noticeable abnormality  PSYCH: pleasant and cooperative, no obvious depression or anxiety  ASSESSMENT AND PLAN:  Discussed the following assessment and plan:  Upper respiratory infection  -likely viral, advised supportive care and return precautions, hycodan cough med provided - risks discussed -Patient advised to return or notify a doctor immediately if symptoms worsen or persist or new concerns arise.  There are no Patient Instructions on file for this visit.   Kriste Basque R.

## 2012-09-29 ENCOUNTER — Encounter: Payer: Self-pay | Admitting: Internal Medicine

## 2012-09-29 ENCOUNTER — Ambulatory Visit (INDEPENDENT_AMBULATORY_CARE_PROVIDER_SITE_OTHER): Payer: BC Managed Care – PPO | Admitting: Internal Medicine

## 2012-09-29 VITALS — BP 120/84 | HR 70 | Temp 98.1°F | Wt 182.0 lb

## 2012-09-29 DIAGNOSIS — J309 Allergic rhinitis, unspecified: Secondary | ICD-10-CM

## 2012-09-29 DIAGNOSIS — J069 Acute upper respiratory infection, unspecified: Secondary | ICD-10-CM

## 2012-09-29 DIAGNOSIS — H109 Unspecified conjunctivitis: Secondary | ICD-10-CM

## 2012-09-29 MED ORDER — HYDROCODONE-HOMATROPINE 5-1.5 MG/5ML PO SYRP: 5.0000 mL | ORAL_SOLUTION | Freq: Four times a day (QID) | ORAL | Status: DC | PRN

## 2012-09-29 MED ORDER — OLOPATADINE HCL 0.2 % OP SOLN: 1.0000 [drp] | Freq: Every day | OPHTHALMIC | Status: DC

## 2012-09-29 MED ORDER — MOMETASONE FUROATE 50 MCG/ACT NA SUSP: NASAL | Status: DC

## 2012-09-29 NOTE — Patient Instructions (Signed)
You have allergies but probably  also have a viral respiratory infection.  Can begin the nasal cortisone for the stuffiness . Can add the eye drop if needed  For eye allergy sx.   Cough med as needed.  If fever  persistent or progressive or wheezing consider adding prednisone and or inhalers .

## 2012-09-29 NOTE — Progress Notes (Signed)
Chief Complaint  Patient presents with  . Allergic Rhinitis     HPI: Patient comes in today for SDA for  new problem evaluation. Saturday clinic Onset 3-4 days head congestion cough dry mostly stuffy nose no sig itching but then awoek with eyes  Irritated and felt closed shut   No fever some hoarseness  No itchy eyer  No hx of asthma but rx for wheezing last year with prednisone.  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  Past Medical History  Diagnosis Date  . Headache   . Anxiety     Family History  Problem Relation Age of Onset  . Hypertension Father   . Diabetes Father   . Coronary artery disease Father   . Colon polyps Father   . Asthma Mother   . Lupus Sister   . Anxiety disorder      History   Social History  . Marital Status: Single    Spouse Name: N/A    Number of Children: N/A  . Years of Education: N/A   Social History Main Topics  . Smoking status: Never Smoker   . Smokeless tobacco: None  . Alcohol Use: No  . Drug Use: No  . Sexually Active: None   Other Topics Concern  . None   Social History Narrative  . None    Outpatient Encounter Prescriptions as of 09/29/2012  Medication Sig Dispense Refill  . HYDROcodone-acetaminophen (NORCO) 7.5-325 MG per tablet Take 1 tablet by mouth every 6 (six) hours as needed for pain.  30 tablet  5  . nadolol (CORGARD) 40 MG tablet Take 1 tablet (40 mg total) by mouth daily.  100 tablet  3  . promethazine (PHENERGAN) 25 MG tablet Take 1 tablet (25 mg total) by mouth every 8 (eight) hours as needed for nausea.  30 tablet  2  . SUMAtriptan (IMITREX) 50 MG tablet Take 1 tablet (50 mg total) by mouth every 2 (two) hours as needed.  10 tablet  6  . HYDROcodone-homatropine (HYCODAN) 5-1.5 MG/5ML syrup Take 5 mLs by mouth every 6 (six) hours as needed for cough.  180 mL  0  . mometasone (NASONEX) 50 MCG/ACT nasal spray 2 spray each  nostril q d  17 g  0  . Olopatadine HCl 0.2 % SOLN Apply 1 drop to eye daily. For allergy eye  1 Bottle  0  . [DISCONTINUED] HYDROcodone-homatropine (HYCODAN) 5-1.5 MG/5ML syrup Take 5 mLs by mouth at bedtime as needed for cough.  120 mL  0   No facility-administered encounter medications on file as of 09/29/2012.    EXAM:  BP 120/84  Pulse 70  Temp(Src) 98.1 F (36.7 C) (Oral)  Wt 182 lb (82.555 kg)  BMI 30.51 kg/m2  SpO2 98%  Body mass index is 30.51 kg/(m^2). WDWN in NAD  quiet respirations; mildly congested  somewhat hoarse. Non toxic . HEENT: Normocephalic ;atraumatic , Eyes;  PERRL, EOMs  Full, lids and conjunctiva 1+ pink no dc seen bilaterallyu,,Ears: no deformities, canals nl, TM landmarks normal, Nose: no deformity or discharge but  Very congested;face non  tender Mouth : OP clear without lesion or edema . Neck: Supple without adenopathy or masses or bruits Chest:  Clear to A&P without wheezes rales or rhonchi CV:  S1-S2 no gallops or murmurs peripheral perfusion is normal Skin :nl perfusion and no acute rashes  PSYCH: pleasant and  cooperative, no obvious depression or anxiety  ASSESSMENT AND PLAN:  Discussed the following assessment and plan:  URI, acute - with poss allergy also  presumed viral  Allergic rhinitis  Conjunctivitis unspecified - viral vs allergic uncertain Counseled. Fu with alarm sx of if  persistent or progressive  -Patient advised to return or notify health care team  if symptoms worsen or persist or new concerns arise.  Patient Instructions  You have allergies but probably  also have a viral respiratory infection.  Can begin the nasal cortisone for the stuffiness . Can add the eye drop if needed  For eye allergy sx.   Cough med as needed.  If fever  persistent or progressive or wheezing consider adding prednisone and or inhalers .      Neta Mends. Tallin Hart M.D.

## 2012-10-01 ENCOUNTER — Telehealth: Payer: Self-pay | Admitting: Family Medicine

## 2012-10-01 NOTE — Telephone Encounter (Signed)
[facility] Triage Call Report Triage Record Num: [id number] Operator: Di Kindle Patient Name: Evelyn Edwards Call Date & Time: 09/29/2012 8:59:19AM Patient Phone: [phone] PCP: Eugenio Hoes. Todd Patient Gender: Female PCP Fax : [phone] Patient DOB: 17-Nov-1960 [facility] Name[facility] Reason for Call: Tish reports onset 09/29/12 of both Eyes swollen and red; with discharge, itching, afebrile, hoarse, yellowish nasal discharge: using OTC allergy, not helping. Guideline: Eye Irritation, Disposition: See within 24 hours due to eye irritation, with drainage, appt 1245 09/29/12 with Dr Bartolo Darter office. Verbalizes understanding of care advice. Protocol(s) Used: Eye: Infection or Irritation Recommended Outcome per Protocol: See Provider within 24 hours Reason for Outcome: New onset of eye redness, irritation/foreign body sensation or gritty feeling with yellow/green drainage Care Advice: Clean any eye drainage with clean, wet cloth; use warm tap water. Do not use same cloth on unaffected eye. Do not share washcloth with others. ~ Limit or avoid exposure to irritants and allergens (e.g. air pollution, smoke/smoking, chemicals, dust, pollen, pet dander, etc.) ~ Call provider if symptoms get worse, or you develop increasing eye pain, changes in vision, or blisters or sores on eye or insides of eyelids. ~ ~ Don't use eye makeup or wear contact lenses until there have been no symptoms for at least 24 hours. Discard mascara, eye liner, and eye shadow as they can be a source of infection. To avoid eye infections, discard these eye makeups every 3 to 4 months. Do not share eye cosmetics. ~ 04/

## 2012-11-13 ENCOUNTER — Other Ambulatory Visit: Payer: Self-pay

## 2012-11-13 DIAGNOSIS — Z1231 Encounter for screening mammogram for malignant neoplasm of breast: Secondary | ICD-10-CM

## 2012-12-17 ENCOUNTER — Other Ambulatory Visit: Payer: Self-pay | Admitting: Family Medicine

## 2012-12-19 ENCOUNTER — Ambulatory Visit: Payer: BC Managed Care – PPO

## 2013-03-13 ENCOUNTER — Telehealth: Payer: Self-pay | Admitting: Family Medicine

## 2013-03-13 MED ORDER — HYDROCODONE-ACETAMINOPHEN 7.5-325 MG PO TABS: ORAL_TABLET | ORAL | Status: DC

## 2013-03-13 NOTE — Telephone Encounter (Signed)
Pt is calling to request refill of her HYDROcodone-acetaminophen (NORCO) 7.5-325 MG per tablet. Please assist.

## 2013-03-14 ENCOUNTER — Other Ambulatory Visit: Payer: Self-pay | Admitting: Family Medicine

## 2013-03-14 NOTE — Telephone Encounter (Signed)
Left message on machine for patient that Rx ready for pick up. 

## 2013-03-15 ENCOUNTER — Ambulatory Visit (INDEPENDENT_AMBULATORY_CARE_PROVIDER_SITE_OTHER): Payer: BC Managed Care – PPO | Admitting: *Deleted

## 2013-03-15 DIAGNOSIS — Z23 Encounter for immunization: Secondary | ICD-10-CM

## 2013-04-29 ENCOUNTER — Telehealth: Payer: Self-pay | Admitting: Family Medicine

## 2013-04-29 MED ORDER — HYDROCODONE-ACETAMINOPHEN 7.5-325 MG PO TABS: ORAL_TABLET | ORAL | Status: DC

## 2013-04-29 NOTE — Telephone Encounter (Signed)
Left message on machine for patient that Rx is ready for pick up.  #20 per Dr Tawanna Cooler.  Patient should schedule an office vi ist if more medication is needed.

## 2013-04-29 NOTE — Telephone Encounter (Signed)
Pt needs new rx hydrocodone °

## 2013-05-13 ENCOUNTER — Telehealth: Payer: Self-pay | Admitting: Family Medicine

## 2013-05-13 NOTE — Telephone Encounter (Signed)
Spoke with patient.  Please schedule her for 10 am tomorrow

## 2013-05-13 NOTE — Telephone Encounter (Signed)
lmom for pt to come in at 10 am.

## 2013-05-13 NOTE — Telephone Encounter (Signed)
Pt states she had bm yesterday and had blood in her stool and wter the water was red. Pt had stomach pain yesterday, but no pain today. pls advise.

## 2013-05-13 NOTE — Telephone Encounter (Signed)
Pt is still waiting on callback

## 2013-05-13 NOTE — Telephone Encounter (Signed)
Pt aware/kh 

## 2013-05-14 ENCOUNTER — Encounter: Payer: Self-pay | Admitting: Family Medicine

## 2013-05-14 ENCOUNTER — Ambulatory Visit (INDEPENDENT_AMBULATORY_CARE_PROVIDER_SITE_OTHER): Payer: BC Managed Care – PPO | Admitting: Family Medicine

## 2013-05-14 VITALS — BP 130/80 | Temp 97.9°F | Wt 184.0 lb

## 2013-05-14 DIAGNOSIS — K625 Hemorrhage of anus and rectum: Secondary | ICD-10-CM | POA: Insufficient documentation

## 2013-05-14 DIAGNOSIS — R51 Headache: Secondary | ICD-10-CM

## 2013-05-14 MED ORDER — NADOLOL 40 MG PO TABS: ORAL_TABLET | ORAL | Status: DC

## 2013-05-14 MED ORDER — SUMATRIPTAN SUCCINATE 50 MG PO TABS: 50.0000 mg | ORAL_TABLET | ORAL | Status: DC | PRN

## 2013-05-14 NOTE — Patient Instructions (Signed)
We will set you up for a followup colonoscopy and GI  Continue your current medications  Set up a time in March for your annual exam

## 2013-05-14 NOTE — Progress Notes (Signed)
Pre visit review using our clinic review tool, if applicable. No additional management support is needed unless otherwise documented below in the visit note. 

## 2013-05-14 NOTE — Progress Notes (Signed)
   Subjective:    Patient ID: Evelyn Edwards, female    DOB: 02/02/1961, 52 y.o.   MRN: [id number]  HPI Evelyn Edwards is a 52 year old female nonsmoker who comes in today for evaluation of bright red rectal bleeding  She states this past Sunday 2 days ago she had a normal bowel movement on Sunday morning and noticed a 12 for blood. It was painless.  She's never had a problem like this before. She had a colonoscopy about 10 years ago because her father had a history of colon polyps.  6 GI and bowel review of systems otherwise negative specifically no constipation weight loss change in stool diameter etc. etc.   Review of Systems Review of systems negative    Objective:   Physical Exam  Well-developed and nourished female no acute distress examination the abdomen is negative  The rectum appears normal. There are no external hemorrhoids. Digital rectal exam normal stool guaiac-negative      Assessment & Plan:  Bright red rectal bleeding probably secondary to a internal hemorrhoid that is resolved however since she has a family history of colon polyps and she's due to have a followup colonoscopy will refer her to GI

## 2013-05-28 ENCOUNTER — Telehealth: Payer: Self-pay | Admitting: Family Medicine

## 2013-05-28 MED ORDER — HYDROCODONE-ACETAMINOPHEN 7.5-325 MG PO TABS: ORAL_TABLET | ORAL | Status: DC

## 2013-05-28 NOTE — Telephone Encounter (Signed)
Pt needs new rx hydrocodone °

## 2013-05-28 NOTE — Telephone Encounter (Signed)
Rx ready for pick up and patient is aware 

## 2013-06-04 ENCOUNTER — Ambulatory Visit
Admission: RE | Admit: 2013-06-04 | Discharge: 2013-06-04 | Disposition: A | Discharge status: Home / Self Care | Payer: BC Managed Care – PPO | Source: Ambulatory Visit

## 2013-06-04 DIAGNOSIS — Z1231 Encounter for screening mammogram for malignant neoplasm of breast: Secondary | ICD-10-CM

## 2013-06-18 ENCOUNTER — Encounter: Payer: Self-pay | Admitting: Internal Medicine

## 2013-06-18 ENCOUNTER — Ambulatory Visit (INDEPENDENT_AMBULATORY_CARE_PROVIDER_SITE_OTHER): Payer: BC Managed Care – PPO | Admitting: Internal Medicine

## 2013-06-18 VITALS — BP 132/68 | HR 70 | Ht 64.5 in | Wt 185.0 lb

## 2013-06-18 DIAGNOSIS — Z8371 Family history of colonic polyps: Secondary | ICD-10-CM

## 2013-06-18 DIAGNOSIS — K625 Hemorrhage of anus and rectum: Secondary | ICD-10-CM

## 2013-06-18 MED ORDER — MOVIPREP 100 G PO SOLR: 1.0000 | Freq: Once | ORAL | Status: DC

## 2013-06-18 NOTE — Progress Notes (Signed)
HISTORY OF PRESENT ILLNESS:  Evelyn Edwards is a 53 y.o. female with a history of migraine headaches. She is sent today by Dr. Tawanna Coolerodd regarding rectal bleeding. Patient describes an episode of moderate rectal bleeding in December. Blood associated with the stool and in the toilet bowl. No associated rectal pain or abdominal pain. She saw Dr. Tawanna Coolerodd 2 days later. At that time, rectal examination was normal. No external hemorrhoids or other problems identified. The stool was Hemoccult negative. She does have a family history of colon polyps in her father. The patient did undergo complete colonoscopy 11/27/2007. This was normal. Aside from nausea without vomiting, GI review of systems is otherwise negative. Her overall health has been good. 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  REVIEW OF SYSTEMS:  All non-GI ROS negative except for  Past Medical History  Diagnosis Date  . Headache(784.0)   . Anxiety     Past Surgical History  Procedure Laterality Date  . Abdominal hysterectomy      ovaries were left intact    Social History Evelyn Edwards  reports that she has never smoked. She does not have any smokeless tobacco history on file. She reports that she does not drink alcohol or use illicit drugs.  family history includes Anxiety disorder in an other family member; Asthma in her mother; Colon polyps in her father and father; Coronary artery disease in her father; Diabetes in her father; Hypertension in her father; Lupus in her sister.  No Known Allergies     PHYSICAL EXAMINATION: Vital signs: BP 132/68  Pulse 70  Ht 5' 4.5" (1.638 m)  Wt 185 lb (83.915 kg)  BMI 31.28 kg/m2  Constitutional: generally well-appearing, no acute distress Psychiatric: alert and oriented x3, cooperative Eyes: extraocular movements intact, anicteric, conjunctiva pink Mouth: oral pharynx moist, no lesions Neck: supple no lymphadenopathy Cardiovascular: heart regular rate and rhythm, no  murmur Lungs: clear to auscultation bilaterally Abdomen: soft, nontender, nondistended, no obvious ascites, no peritoneal signs, normal bowel sounds, no organomegaly Rectal: Deferred until colonoscopy Extremities: no lower extremity edema bilaterally Skin: no lesions on visible extremities Neuro: No focal deficits.   ASSESSMENT:  #1. Episode of moderate rectal bleeding as described. Etiology unclear #2. Family history of colon polyps in father. #3. A patient with negative screening colonoscopy in June 2009  PLAN:  #1. Colonoscopy to evaluate rectal bleeding. Movi prep prescribed. The patient instructed on its use.The nature of the procedure, as well as the risks, benefits, and alternatives were carefully and thoroughly reviewed with the patient. Ample time for discussion and questions allowed. The patient understood, was satisfied, and agreed to proceed.

## 2013-06-18 NOTE — Patient Instructions (Signed)

## 2013-06-20 ENCOUNTER — Encounter: Payer: Self-pay | Admitting: Internal Medicine

## 2013-06-26 ENCOUNTER — Ambulatory Visit (AMBULATORY_SURGERY_CENTER): Payer: BC Managed Care – PPO | Admitting: Internal Medicine

## 2013-06-26 ENCOUNTER — Encounter: Payer: Self-pay | Admitting: Internal Medicine

## 2013-06-26 VITALS — BP 168/69 | HR 49 | Temp 97.4°F | Resp 17 | Ht 64.0 in | Wt 185.0 lb

## 2013-06-26 DIAGNOSIS — Z8371 Family history of colonic polyps: Secondary | ICD-10-CM

## 2013-06-26 DIAGNOSIS — K625 Hemorrhage of anus and rectum: Secondary | ICD-10-CM

## 2013-06-26 MED ORDER — SODIUM CHLORIDE 0.9 % IV SOLN: 500.0000 mL | INTRAVENOUS | Status: DC

## 2013-06-26 NOTE — Op Note (Signed)
[facility] [address] [place], [id number]   COLONOSCOPY PROCEDURE REPORT  PATIENT: Evelyn Edwards, [name]  MR#: [id number] BIRTHDATE: 10/15/1960 , 52  yrs. old GENDER: Female ENDOSCOPIST: Roxy CedarJohn N Britton Perkinson Jr, MD REFERRED BY:.  Self / Office PROCEDURE DATE:  06/26/2013 PROCEDURE:   Colonoscopy, diagnostic First Screening Colonoscopy - Avg.  risk and is 50 yrs.  old or older - No.  Prior Negative Screening - Now for repeat screening. N/A  History of Adenoma - Now for follow-up colonoscopy & has been > or = to 3 yrs.  N/A  Polyps Removed Today? No.  Recommend repeat exam, <10 yrs? No. ASA CLASS:   Class II INDICATIONS:rectal bleeding. MEDICATIONS: MAC sedation, administered by CRNA and propofol (Diprivan) 300mg  IV  DESCRIPTION OF PROCEDURE:   After the risks benefits and alternatives of the procedure were thoroughly explained, informed consent was obtained.  A digital rectal exam revealed no abnormalities of the rectum.   The LB [id number]  endoscope was introduced through the anus and advanced to the cecum, which was identified by both the appendix and ileocecal valve. No adverse events experienced.   The quality of the prep was excellent, using MoviPrep  The instrument was then slowly withdrawn as the colon was fully examined.      COLON FINDINGS: A normal appearing cecum, ileocecal valve, and appendiceal orifice were identified.  The ascending, hepatic flexure, transverse, splenic flexure, descending, sigmoid colon and rectum appeared unremarkable.  No polyps or cancers were seen. Retroflexed views revealed internal hemorrhoids. The time to cecum=5 minutes 06 seconds.  Withdrawal time=8 minutes 17 seconds. The scope was withdrawn and the procedure completed.   COMPLICATIONS: There were no complications.  ENDOSCOPIC IMPRESSION: 1. Normal colon  RECOMMENDATIONS: 1. Continue current colorectal screening recommendations for "routine risk" patients with  a repeat colonoscopy in 10 years.   eSigned:  Roxy CedarJohn N Jakaden Ouzts Jr, MD 06/26/2013 8:53 AM   cc: Roderick PeeJeffrey A Todd, MD and The Patient

## 2013-06-26 NOTE — Patient Instructions (Signed)
Discharge instructions given with verbal understanding. Normal exam. Resume previous medications. YOU HAD AN ENDOSCOPIC PROCEDURE TODAY AT THE [facility]: Refer to the procedure report that was given to you for any specific questions about what was found during the examination.  If the procedure report does not answer your questions, please call your gastroenterologist to clarify.  If you requested that your care partner not be given the details of your procedure findings, then the procedure report has been included in a sealed envelope for you to review at your convenience later.  YOU SHOULD EXPECT: Some feelings of bloating in the abdomen. Passage of more gas than usual.  Walking can help get rid of the air that was put into your GI tract during the procedure and reduce the bloating. If you had a lower endoscopy (such as a colonoscopy or flexible sigmoidoscopy) you may notice spotting of blood in your stool or on the toilet paper. If you underwent a bowel prep for your procedure, then you may not have a normal bowel movement for a few days.  DIET: Your first meal following the procedure should be a light meal and then it is ok to progress to your normal diet.  A half-sandwich or bowl of soup is an example of a good first meal.  Heavy or fried foods are harder to digest and may make you feel nauseous or bloated.  Likewise meals heavy in dairy and vegetables can cause extra gas to form and this can also increase the bloating.  Drink plenty of fluids but you should avoid alcoholic beverages for 24 hours.  ACTIVITY: Your care partner should take you home directly after the procedure.  You should plan to take it easy, moving slowly for the rest of the day.  You can resume normal activity the day after the procedure however you should NOT DRIVE or use heavy machinery for 24 hours (because of the sedation medicines used during the test).    SYMPTOMS TO REPORT IMMEDIATELY: A gastroenterologist  can be reached at any hour.  During normal business hours, 8:30 AM to 5:00 PM Monday through Friday, call [phone].  After hours and on weekends, please call the GI answering service at [phone] who will take a message and have the physician on call contact you.   Following lower endoscopy (colonoscopy or flexible sigmoidoscopy):  Excessive amounts of blood in the stool  Significant tenderness or worsening of abdominal pains  Swelling of the abdomen that is new, acute  Fever of 100F or higher  FOLLOW UP: If any biopsies were taken you will be contacted by phone or by letter within the next 1-3 weeks.  Call your gastroenterologist if you have not heard about the biopsies in 3 weeks.  Our staff will call the home number listed on your records the next business day following your procedure to check on you and address any questions or concerns that you may have at that time regarding the information given to you following your procedure. This is a courtesy call and so if there is no answer at the home number and we have not heard from you through the emergency physician on call, we will assume that you have returned to your regular daily activities without incident.  SIGNATURES/CONFIDENTIALITY: You and/or your care partner have signed paperwork which will be entered into your electronic medical record.  These signatures attest to the fact that that the information above on your After Visit Summary has been reviewed   and is understood.  Full responsibility of the confidentiality of this discharge information lies with you and/or your care-partner. 

## 2013-06-26 NOTE — Progress Notes (Signed)
Report to pacu rn, vss, bbs=clear 

## 2013-06-27 ENCOUNTER — Telehealth: Payer: Self-pay

## 2013-06-27 NOTE — Telephone Encounter (Signed)
Left message on answering machine. 

## 2013-07-15 ENCOUNTER — Telehealth: Payer: Self-pay | Admitting: Family Medicine

## 2013-07-15 MED ORDER — HYDROCODONE-ACETAMINOPHEN 7.5-325 MG PO TABS: ORAL_TABLET | ORAL | Status: DC

## 2013-07-15 NOTE — Telephone Encounter (Signed)
Rx ready for pick up.  Left message on machine for patient. 

## 2013-07-15 NOTE — Telephone Encounter (Signed)
Pt requesting refill of HYDROcodone-acetaminophen (NORCO) 7.5-325 MG per tablet ° °

## 2013-08-05 ENCOUNTER — Other Ambulatory Visit: Payer: BC Managed Care – PPO

## 2013-08-08 ENCOUNTER — Other Ambulatory Visit (INDEPENDENT_AMBULATORY_CARE_PROVIDER_SITE_OTHER): Payer: BC Managed Care – PPO

## 2013-08-08 DIAGNOSIS — Z0000 Encounter for general adult medical examination without abnormal findings: Secondary | ICD-10-CM

## 2013-08-08 LAB — BASIC METABOLIC PANEL
BUN: 15 mg/dL (ref 6–23)
CO2: 28 meq/L (ref 19–32)
Calcium: 9.2 mg/dL (ref 8.4–10.5)
Chloride: 105 mEq/L (ref 96–112)
Creatinine, Ser: 0.7 mg/dL (ref 0.4–1.2)
GFR: 107.22 mL/min (ref >60.00)
Glucose, Bld: 73 mg/dL (ref 70–99)
POTASSIUM: 4 meq/L (ref 3.5–5.1)
Sodium: 139 mEq/L (ref 135–145)

## 2013-08-08 LAB — CBC WITH DIFFERENTIAL/PLATELET
BASOS PCT: 0.5 % (ref 0.0–3.0)
Basophils Absolute: 0 10*3/uL (ref 0.0–0.1)
EOS ABS: 0.1 10*3/uL (ref 0.0–0.7)
EOS PCT: 1.3 % (ref 0.0–5.0)
HEMATOCRIT: 37.4 % (ref 36.0–46.0)
Hemoglobin: 12.3 g/dL (ref 12.0–15.0)
LYMPHS ABS: 2 10*3/uL (ref 0.7–4.0)
Lymphocytes Relative: 45.9 % (ref 12.0–46.0)
MCHC: 32.9 g/dL (ref 30.0–36.0)
MCV: 89.9 fl (ref 78.0–100.0)
MONO ABS: 0.4 10*3/uL (ref 0.1–1.0)
Monocytes Relative: 8.8 % (ref 3.0–12.0)
Neutro Abs: 1.9 10*3/uL (ref 1.4–7.7)
Neutrophils Relative %: 43.5 % (ref 43.0–77.0)
Platelets: 245 10*3/uL (ref 150.0–400.0)
RBC: 4.17 Mil/uL (ref 3.87–5.11)
RDW: 13.1 % (ref 11.5–14.6)
WBC: 4.4 10*3/uL — AB (ref 4.5–10.5)

## 2013-08-08 LAB — LIPID PANEL
CHOL/HDL RATIO: 4
Cholesterol: 179 mg/dL (ref 0–200)
HDL: 49.7 mg/dL (ref >39.00)
LDL CALC: 111 mg/dL — AB (ref 0–99)
Triglycerides: 93 mg/dL (ref 0.0–149.0)
VLDL: 18.6 mg/dL (ref 0.0–40.0)

## 2013-08-08 LAB — POCT URINALYSIS DIPSTICK
BILIRUBIN UA: NEGATIVE
Blood, UA: NEGATIVE
GLUCOSE UA: NEGATIVE
Ketones, UA: NEGATIVE
Leukocytes, UA: NEGATIVE
NITRITE UA: NEGATIVE
Protein, UA: NEGATIVE
Spec Grav, UA: >=1.03
UROBILINOGEN UA: 0.2
pH, UA: 5.5

## 2013-08-08 LAB — HEPATIC FUNCTION PANEL
ALT: 14 U/L (ref 0–35)
AST: 15 U/L (ref 0–37)
Albumin: 3.6 g/dL (ref 3.5–5.2)
Alkaline Phosphatase: 72 U/L (ref 39–117)
BILIRUBIN TOTAL: 0.5 mg/dL (ref 0.3–1.2)
Bilirubin, Direct: 0.1 mg/dL (ref 0.0–0.3)
Total Protein: 7.3 g/dL (ref 6.0–8.3)

## 2013-08-08 LAB — TSH: TSH: 0.7 u[IU]/mL (ref 0.35–5.50)

## 2013-08-12 ENCOUNTER — Ambulatory Visit (INDEPENDENT_AMBULATORY_CARE_PROVIDER_SITE_OTHER): Payer: BC Managed Care – PPO | Admitting: Family Medicine

## 2013-08-12 ENCOUNTER — Encounter: Payer: Self-pay | Admitting: Family Medicine

## 2013-08-12 VITALS — BP 120/80 | HR 48 | Temp 97.6°F | Resp 20 | Ht 65.5 in | Wt 193.0 lb

## 2013-08-12 DIAGNOSIS — R51 Headache: Secondary | ICD-10-CM

## 2013-08-12 DIAGNOSIS — R232 Flushing: Secondary | ICD-10-CM

## 2013-08-12 MED ORDER — PROMETHAZINE HCL 25 MG PO TABS: 25.0000 mg | ORAL_TABLET | Freq: Three times a day (TID) | ORAL | Status: DC | PRN

## 2013-08-12 MED ORDER — ESTROGENS CONJUGATED 0.3 MG PO TABS: ORAL_TABLET | ORAL | Status: DC

## 2013-08-12 MED ORDER — HYDROCODONE-ACETAMINOPHEN 7.5-325 MG PO TABS
ORAL_TABLET | ORAL | Status: DC
Start: 2013-08-12 — End: 2013-12-30

## 2013-08-12 MED ORDER — TOPIRAMATE 50 MG PO TABS: ORAL_TABLET | ORAL | Status: DC

## 2013-08-12 MED ORDER — RIZATRIPTAN BENZOATE 5 MG PO TBDP: 5.0000 mg | ORAL_TABLET | ORAL | Status: DC | PRN

## 2013-08-12 NOTE — Progress Notes (Signed)
   Subjective:    Patient ID: Evelyn Edwards, female    DOB: 12/04/1960, 53 y.o.   MRN: [id number]  HPI Evelyn Edwards is a 53 year old female nonsmoker who comes in today for general physical examination  She continues to work with special [place] [facility] [employer]  She gets routine eye care, dental care, BSE monthly, recent mammogram normal. Shows cyst her right breast has disappeared  Showed a colonoscopy last month which was normal.  She states the migraines have gotten worse. She 72 weeks sometimes she'll wake up with them in the morning and she can't take Imitrex because it doesn't work. She then has to take a Vicodin. She's been on Corgard 40 mg a day prophylactically but it doesn't seem to be helping.  She's also having trouble with hot flashes. This started about 4 months ago. He's also having some dryness of her vagina. No mood changes   Review of Systems  Constitutional: Negative.   HENT: Negative.   Eyes: Negative.   Respiratory: Negative.   Cardiovascular: Negative.   Gastrointestinal: Negative.   Endocrine: Negative.   Genitourinary: Negative.   Musculoskeletal: Negative.   Allergic/Immunologic: Negative.   Neurological: Negative.   Hematological: Negative.   Psychiatric/Behavioral: Negative.        Objective:   Physical Exam  Nursing note and vitals reviewed. Constitutional: She appears well-developed and well-nourished.  HENT:  Head: Normocephalic and atraumatic.  Right Ear: External ear normal.  Left Ear: External ear normal.  Nose: Nose normal.  Mouth/Throat: Oropharynx is clear and moist.  Eyes: EOM are normal. Pupils are equal, round, and reactive to light.  Neck: Normal range of motion. Neck supple. No thyromegaly present.  Cardiovascular: Normal rate, regular rhythm, normal heart sounds and intact distal pulses.  Exam reveals no gallop and no friction rub.   No murmur heard. Pulmonary/Chest: Effort normal and breath sounds normal.    Abdominal: Soft. Bowel sounds are normal. She exhibits no distension and no mass. There is no tenderness. There is no rebound.  Genitourinary:  Bilateral breast exam normal no palpable abnormalities  She had her uterus removed for nonmalignant reasons ovaries were left intact therefore pelvic exam not indicated  Musculoskeletal: Normal range of motion.  Lymphadenopathy:    She has no cervical adenopathy.  Neurological: She is alert. She has normal reflexes. No cranial nerve deficit. She exhibits normal muscle tone. Coordination normal.  Skin: Skin is warm and dry.  Psychiatric: She has a normal mood and affect. Her behavior is normal. Judgment and thought content normal.          Assessment & Plan:  Healthy female  Migraine headaches unresponsive to beta blockers and Imitrex,,,,,,,,,,,,,, begin a trial of Topamax  Post menopausal hot flashes,,,,,, low-dose Premarin 0.3 daily

## 2013-08-12 NOTE — Patient Instructions (Signed)
Stop the Imitrex  Maxalt..........Marland Kitchen. 1 immediately at the first sign of a migraine  Topamax 50 mg............ one half tab at bedtime for 2 weeks then increase to one tablet at bedtime  Followup in one month  Taper migraine diary  Premarin 0.3.........Marland Kitchen. 1 pill at bedtime for the hot flashes

## 2013-08-12 NOTE — Progress Notes (Signed)
Pre-visit discussion using our clinic review tool. No additional management support is needed unless otherwise documented below in the visit note.  

## 2013-09-17 ENCOUNTER — Ambulatory Visit: Payer: BC Managed Care – PPO | Admitting: Family Medicine

## 2013-09-24 ENCOUNTER — Encounter: Payer: Self-pay | Admitting: Family Medicine

## 2013-09-24 ENCOUNTER — Ambulatory Visit (INDEPENDENT_AMBULATORY_CARE_PROVIDER_SITE_OTHER): Payer: BC Managed Care – PPO | Admitting: Family Medicine

## 2013-09-24 VITALS — BP 130/80 | Temp 98.3°F | Wt 198.0 lb

## 2013-09-24 DIAGNOSIS — R51 Headache: Secondary | ICD-10-CM

## 2013-09-24 DIAGNOSIS — G44009 Cluster headache syndrome, unspecified, not intractable: Secondary | ICD-10-CM

## 2013-09-24 MED ORDER — TRAMADOL HCL 50 MG PO TABS: 50.0000 mg | ORAL_TABLET | Freq: Three times a day (TID) | ORAL | Status: DC | PRN

## 2013-09-24 NOTE — Patient Instructions (Signed)
Topamax 50 mg.......Marland Kitchen. 1 daily  Maxalt.......... 5 mg...............Marland Kitchen. 1 stat at the onset of the first sign that you might be getting a migraine  Return when necessary

## 2013-09-24 NOTE — Progress Notes (Signed)
   Subjective:    Patient ID: Evelyn Edwards, female    DOB: 09/16/1960, 53 y.o.   MRN: [id number]  HPI Evelyn Edwards is a 53 year old married female nonsmoker who comes in today for followup of migraine headaches  She was on beta blockers 40 mg daily but she was having a lot of migraines. 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 We stopped the Corgard start on Topamax 25 mg daily for 2 weeks then increase it to 50 mg. She's been on 50 mg now for about 10 days and has had no headaches. During this time she had a couple breakthrough migraines but she would wait for about 10 minutes to take her medication   Review of Systems    review of systems otherwise negative Objective:   Physical Exam  Well-developed well-nourished female no acute distress vital signs stable she is afebrile      Assessment & Plan:  Migraine headaches under good control with Topamax and Maxalt continue that program

## 2013-09-24 NOTE — Progress Notes (Signed)
Pre visit review using our clinic review tool, if applicable. No additional management support is needed unless otherwise documented below in the visit note. 

## 2013-12-30 ENCOUNTER — Ambulatory Visit (INDEPENDENT_AMBULATORY_CARE_PROVIDER_SITE_OTHER): Payer: BC Managed Care – PPO | Admitting: Family Medicine

## 2013-12-30 ENCOUNTER — Other Ambulatory Visit: Payer: Self-pay | Admitting: Family Medicine

## 2013-12-30 ENCOUNTER — Encounter: Payer: Self-pay | Admitting: Family Medicine

## 2013-12-30 VITALS — BP 140/84 | HR 78 | Temp 98.0°F | Ht 65.5 in | Wt 192.0 lb

## 2013-12-30 DIAGNOSIS — M545 Low back pain, unspecified: Secondary | ICD-10-CM

## 2013-12-30 MED ORDER — CYCLOBENZAPRINE HCL 10 MG PO TABS: 10.0000 mg | ORAL_TABLET | Freq: Every evening | ORAL | Status: DC | PRN

## 2013-12-30 NOTE — Progress Notes (Signed)
No chief complaint on file.   HPI:  Acute visit for:  1) Low back pain: -started a few days ago after moving her bed -reports constant achy moderate pain in low back R > L -denies: radiation, weakness, numbness, fevers, malaise, bowel or bladder incontinence  ROS: See pertinent positives and negatives per HPI.  Past Medical History  Diagnosis Date  . Headache(784.0)   . Anxiety     Past Surgical History  Procedure Laterality Date  . Abdominal hysterectomy      ovaries were left intact    Family History  Problem Relation Age of Onset  . Hypertension Father   . Diabetes Father   . Coronary artery disease Father   . Colon polyps Father   . Asthma Mother   . Lupus Sister   . Anxiety disorder    . Colon polyps Father     History   Social History  . Marital Status: Single    Spouse Name: N/A    Number of Children: N/A  . Years of Education: N/A   Social History Main Topics  . Smoking status: Never Smoker   . Smokeless tobacco: None  . Alcohol Use: No  . Drug Use: No  . Sexual Activity: None   Other Topics Concern  . None   Social History Narrative  . None    Current outpatient prescriptions:estrogens, conjugated, (PREMARIN) 0.3 MG tablet, 1 by mouth each bedtime., Disp: 100 tablet, Rfl: 3;  promethazine (PHENERGAN) 25 MG tablet, Take 1 tablet (25 mg total) by mouth every 8 (eight) hours as needed for nausea., Disp: 30 tablet, Rfl: 2;  rizatriptan (MAXALT-MLT) 5 MG disintegrating tablet, Take 1 tablet (5 mg total) by mouth as needed for migraine. May repeat in 2 hours if needed, Disp: 10 tablet, Rfl: 10 topiramate (TOPAMAX) 50 MG tablet, 1 by mouth each bedtime for migraine headaches, Disp: 100 tablet, Rfl: 3;  traMADol (ULTRAM) 50 MG tablet, Take 1 tablet (50 mg total) by mouth every 8 (eight) hours as needed., Disp: 30 tablet, Rfl: 2;  cyclobenzaprine (FLEXERIL) 10 MG tablet, Take 1 tablet (10 mg total) by mouth at bedtime as needed for muscle spasms., Disp: 10  tablet, Rfl: 0  EXAM:  Filed Vitals:   12/30/13 1052  BP: 140/84  Pulse: 78  Temp: 98 F (36.7 C)    Body mass index is 31.45 kg/(m^2).  GENERAL: vitals reviewed and listed above, alert, oriented, appears well hydrated and in no acute distress  HEENT: atraumatic, conjunttiva clear, no obvious abnormalities on inspection of external nose and ears  NECK: no obvious masses on inspection  Normal Gait Normal inspection of back, no obvious scoliosis or leg length descrepancy No bony TTP Soft tissue TTP at: R lumbar QL -/+ tests: neg trendelenburg, + facet loading R, -SLRT, -CLRT, -FABER, -FADIR Normal muscle strength, sensation to vibratory and light touch and DTRs in LEs bilaterally intact  MS: moves all extremities without noticeable abnormality  PSYCH: pleasant and cooperative, no obvious depression or anxiety  ASSESSMENT AND PLAN:  Discussed the following assessment and plan:  Right-sided low back pain without sciatica - Plan: cyclobenzaprine (FLEXERIL) 10 MG tablet  -we discussed possible serious and likely etiologies, workup and treatment, treatment risks and return precautions -after this discussion, Rinaldo Cloudamela opted for tx per orders and instructions for likely muscle strain -follow up advised in 4 weeks -of course, we advised Rinaldo Cloudamela  to return or notify a doctor immediately if symptoms worsen or persist or new  concerns arise.  -Patient advised to return or notify a doctor immediately if symptoms worsen or persist or new concerns arise.  Patient Instructions  -do the exercises provided at least 5 days per week  -heat for 15 minutes twice daily  -muscle relaxer (flexeril) at night for a few nights  -tylenol 500-1000mg  up to 3 timea daily or Aleve per instructions as needed along with topical sports creams  -follow up in 1 month or sooner as needed     Espn Zeman R.

## 2013-12-30 NOTE — Patient Instructions (Signed)
-  do the exercises provided at least 5 days per week  -heat for 15 minutes twice daily  -muscle relaxer (flexeril) at night for a few nights  -tylenol 500-1000mg  up to 3 timea daily or Aleve per instructions as needed along with topical sports creams  -follow up in 1 month or sooner as needed

## 2013-12-30 NOTE — Progress Notes (Signed)
Pre visit review using our clinic review tool, if applicable. No additional management support is needed unless otherwise documented below in the visit note. 

## 2014-01-30 ENCOUNTER — Ambulatory Visit: Payer: BC Managed Care – PPO | Admitting: Family Medicine

## 2014-06-09 ENCOUNTER — Telehealth: Payer: Self-pay | Admitting: Family Medicine

## 2014-06-09 MED ORDER — HYDROCODONE-HOMATROPINE 5-1.5 MG/5ML PO SYRP: 5.0000 mL | ORAL_SOLUTION | Freq: Three times a day (TID) | ORAL | Status: DC | PRN

## 2014-06-09 NOTE — Telephone Encounter (Signed)
Rx per Dr Tawanna Cooler and patient is aware.  Rx ready for pick up.

## 2014-06-09 NOTE — Telephone Encounter (Signed)
On set Friday no fever, cough is not productive.

## 2014-06-09 NOTE — Telephone Encounter (Signed)
Pt has a cough, headache, would like to see dr todd today. pls advise

## 2014-07-08 ENCOUNTER — Encounter: Payer: Self-pay | Admitting: Family Medicine

## 2014-07-08 ENCOUNTER — Ambulatory Visit (INDEPENDENT_AMBULATORY_CARE_PROVIDER_SITE_OTHER): Payer: BC Managed Care – PPO | Admitting: Family Medicine

## 2014-07-08 VITALS — BP 120/80 | Temp 98.3°F | Wt 191.0 lb

## 2014-07-08 DIAGNOSIS — B9789 Other viral agents as the cause of diseases classified elsewhere: Principal | ICD-10-CM

## 2014-07-08 DIAGNOSIS — J069 Acute upper respiratory infection, unspecified: Secondary | ICD-10-CM | POA: Insufficient documentation

## 2014-07-08 MED ORDER — HYDROCODONE-HOMATROPINE 5-1.5 MG/5ML PO SYRP: 5.0000 mL | ORAL_SOLUTION | Freq: Three times a day (TID) | ORAL | Status: DC | PRN

## 2014-07-08 NOTE — Progress Notes (Signed)
   Subjective:    Patient ID: Evelyn Edwards, female    DOB: 07/18/1960, 54 y.o.   MRN: [id number]  HPI Evelyn Edwards is a 54 year old married female nonsmoker who comes in today for evaluation of cold symptoms for 4 days. She's had head congestion runny no sore throat and cough. No fever    She's had trouble with asthma when she gets a bad cold. No wheezing now    Review of Systems    review of systems negative Objective:   Physical Exam  Well-developed well-nourished female no acute distress vital signs stable she's afebrile HEENT were negative neck was supple no adenopathy lungs are clear      Assessment & Plan:  Viral syndrome plan treat symptomatically....... antibiotics not indicated

## 2014-07-08 NOTE — Patient Instructions (Signed)
Drink lots of water  Vaporizer  Hydromet,,,,,,,,,, 1/2-1 teaspoon 3 times daily when necessary for cough  Return when necessary

## 2014-07-08 NOTE — Progress Notes (Signed)
Pre visit review using our clinic review tool, if applicable. No additional management support is needed unless otherwise documented below in the visit note. 

## 2014-07-14 ENCOUNTER — Other Ambulatory Visit: Payer: Self-pay | Admitting: Family Medicine

## 2014-08-22 ENCOUNTER — Other Ambulatory Visit: Payer: Self-pay | Admitting: Family Medicine

## 2014-10-28 ENCOUNTER — Telehealth: Payer: Self-pay | Admitting: Family Medicine

## 2014-10-28 NOTE — Telephone Encounter (Signed)
[facility] - Client TELEPHONE ADVICE RECORD [facility] Patient Name: Evelyn Edwards DOB: 12/03/1960 Initial Comment caller states she is having dizziness Nurse Assessment Nurse: Chrys RacerKoenig, RN, Alexia FreestoneAnna Marie Date/Time Lamount Cohen(Eastern Time): 10/28/2014 1:50:44 PM Confirm and document reason for call. If symptomatic, describe symptoms. ---caller states she is having dizziness with change of position only, and started this morning Has the patient traveled out of the country within the last 30 days? ---No Does the patient require triage? ---Yes Related visit to physician within the last 2 weeks? ---No Does the PT have any chronic conditions? (i.e. diabetes, asthma, etc.) ---Yes List chronic conditions. ---migraines Did the patient indicate they were pregnant? ---No Guidelines Guideline Title Affirmed Question Affirmed Notes Dizziness - Lightheadedness [1] MILD dizziness (e.g., walking normally) AND [2] has NOT been evaluated by physician for this (Exception: dizziness caused by heat exposure, sudden standing, or poor fluid intake) Final Disposition User See PCP When Office is Open (within 3 days) Chrys RacerKoenig, RN, Alexia Freestonenna Marie Comments Tc back to pt to update on appt 5/25/ wed at 10:15 with Dr Tawanna Coolerodd

## 2014-10-28 NOTE — Telephone Encounter (Signed)
Noted  

## 2014-10-29 ENCOUNTER — Encounter: Payer: Self-pay | Admitting: Family Medicine

## 2014-10-29 ENCOUNTER — Ambulatory Visit (INDEPENDENT_AMBULATORY_CARE_PROVIDER_SITE_OTHER): Payer: BC Managed Care – PPO | Admitting: Family Medicine

## 2014-10-29 VITALS — BP 150/80 | Temp 98.2°F | Wt 194.0 lb

## 2014-10-29 DIAGNOSIS — H8110 Benign paroxysmal vertigo, unspecified ear: Secondary | ICD-10-CM | POA: Insufficient documentation

## 2014-10-29 DIAGNOSIS — R030 Elevated blood-pressure reading, without diagnosis of hypertension: Secondary | ICD-10-CM | POA: Diagnosis not present

## 2014-10-29 DIAGNOSIS — IMO0001 Reserved for inherently not codable concepts without codable children: Secondary | ICD-10-CM

## 2014-10-29 DIAGNOSIS — H8113 Benign paroxysmal vertigo, bilateral: Secondary | ICD-10-CM

## 2014-10-29 NOTE — Patient Instructions (Signed)
Omron pump up digital blood pressure cuff...Marland Kitchen.Marland Kitchen.Marland Kitchen. [employer]  Check your blood pressure daily in the morning  Blood pressure goal 135/85 or less  If after one month........ your blood pressures are all normal......... then I would check your blood pressure weekly because of your family history of hypertension  If after one month your blood pressures are all elevated then bring the data and the device and come to the office and see Angela Adamory nafzinger our new adult nurse practitioner from [facility]

## 2014-10-29 NOTE — Progress Notes (Signed)
   Subjective:    Patient ID: Evelyn Edwards, female    DOB: 04/24/1961, 54 y.o.   MRN: [id number]  HPI [name] is a 54 year old married female nonsmoker who comes in today for evaluation of dizziness  Stay chest morning she went to get out of bed in the room started spinning. She laid down for 15 minutes and went away. She's never had an episode of vertigo before. No hearing loss.  Both her mother her father one brother and one of her 7 sisters have hypertension. BP today 150/80    Review of Systems    review of systems otherwise negative Objective:   Physical Exam Well-developed well-nourished female no acute distress vital signs stable she's afebrile except for BP 150/80  HEENT neuro exam normal       Assessment & Plan:  Elevated blood pressure....... question hypertension......... BP check daily follow-up in 4 weeks  Vertigo......... advised to rest as she has done in the past. ENT consult for Epley's maneuver when necessary

## 2014-10-29 NOTE — Progress Notes (Signed)
Pre visit review using our clinic review tool, if applicable. No additional management support is needed unless otherwise documented below in the visit note. 

## 2014-12-10 ENCOUNTER — Other Ambulatory Visit: Payer: Self-pay | Admitting: Family Medicine

## 2014-12-23 ENCOUNTER — Other Ambulatory Visit: Payer: Self-pay | Admitting: Family Medicine

## 2015-03-04 ENCOUNTER — Ambulatory Visit (INDEPENDENT_AMBULATORY_CARE_PROVIDER_SITE_OTHER): Payer: BC Managed Care – PPO | Admitting: Family Medicine

## 2015-03-04 ENCOUNTER — Encounter: Payer: Self-pay | Admitting: Family Medicine

## 2015-03-04 VITALS — BP 130/90 | Temp 98.3°F | Wt 191.0 lb

## 2015-03-04 DIAGNOSIS — Z23 Encounter for immunization: Secondary | ICD-10-CM

## 2015-03-04 DIAGNOSIS — B9789 Other viral agents as the cause of diseases classified elsewhere: Secondary | ICD-10-CM

## 2015-03-04 DIAGNOSIS — J069 Acute upper respiratory infection, unspecified: Secondary | ICD-10-CM

## 2015-03-04 MED ORDER — HYDROCODONE-HOMATROPINE 5-1.5 MG/5ML PO SYRP: ORAL_SOLUTION | ORAL | Status: DC

## 2015-03-04 NOTE — Patient Instructions (Signed)
Drink lots of liquids  Tylenol......Marland Kitchen Aspirin......Marland Kitchen or Motrin........... when necessary for fever chills and aching all over  Hydromet........Marland Kitchen 1/2-1 teaspoon at bedtime when necessary for cough  Return when necessary

## 2015-03-04 NOTE — Progress Notes (Signed)
   Subjective:    Patient ID: Evelyn Edwards, female    DOB: [birth year], 54 y.o.   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 All the kids on her bus of been sick with colds.   Review of Systems Review of systems negative    Objective:   Physical Exam  Well-developed well-nourished female no acute distress vital signs stable she's afebrile HEENT were negative neck was supple no adenopathy lungs are clear      Assessment & Plan:  Viral syndrome plan treat symptomatically with liquids Tylenol and Hydromet cough syrup

## 2015-03-04 NOTE — Progress Notes (Signed)
Pre visit review using our clinic review tool, if applicable. No additional management support is needed unless otherwise documented below in the visit note. 

## 2015-03-20 ENCOUNTER — Other Ambulatory Visit: Payer: Self-pay | Admitting: Family Medicine

## 2015-05-20 ENCOUNTER — Other Ambulatory Visit: Payer: Self-pay | Admitting: Family Medicine

## 2015-07-22 ENCOUNTER — Other Ambulatory Visit: Payer: Self-pay | Admitting: Family Medicine

## 2015-09-21 ENCOUNTER — Other Ambulatory Visit: Payer: Self-pay | Admitting: Family Medicine

## 2015-10-01 ENCOUNTER — Other Ambulatory Visit (INDEPENDENT_AMBULATORY_CARE_PROVIDER_SITE_OTHER): Payer: BC Managed Care – PPO

## 2015-10-01 DIAGNOSIS — Z0000 Encounter for general adult medical examination without abnormal findings: Secondary | ICD-10-CM | POA: Diagnosis not present

## 2015-10-01 LAB — LIPID PANEL
Cholesterol: 174 mg/dL (ref 0–200)
HDL: 50.9 mg/dL (ref >39.00)
LDL CALC: 107 mg/dL — AB (ref 0–99)
NONHDL: 122.61
Total CHOL/HDL Ratio: 3
Triglycerides: 80 mg/dL (ref 0.0–149.0)
VLDL: 16 mg/dL (ref 0.0–40.0)

## 2015-10-01 LAB — BASIC METABOLIC PANEL
BUN: 22 mg/dL (ref 6–23)
CHLORIDE: 107 meq/L (ref 96–112)
CO2: 26 mEq/L (ref 19–32)
CREATININE: 0.84 mg/dL (ref 0.40–1.20)
Calcium: 9.4 mg/dL (ref 8.4–10.5)
GFR: 90.46 mL/min (ref >60.00)
Glucose, Bld: 88 mg/dL (ref 70–99)
POTASSIUM: 4 meq/L (ref 3.5–5.1)
SODIUM: 140 meq/L (ref 135–145)

## 2015-10-01 LAB — CBC WITH DIFFERENTIAL/PLATELET
BASOS PCT: 1.7 % (ref 0.0–3.0)
Basophils Absolute: 0.1 10*3/uL (ref 0.0–0.1)
EOS ABS: 0.1 10*3/uL (ref 0.0–0.7)
EOS PCT: 1.6 % (ref 0.0–5.0)
HEMATOCRIT: 37 % (ref 36.0–46.0)
HEMOGLOBIN: 12.4 g/dL (ref 12.0–15.0)
LYMPHS PCT: 52.7 % — AB (ref 12.0–46.0)
Lymphs Abs: 2.3 10*3/uL (ref 0.7–4.0)
MCHC: 33.5 g/dL (ref 30.0–36.0)
MCV: 88.8 fl (ref 78.0–100.0)
MONOS PCT: 6.9 % (ref 3.0–12.0)
Monocytes Absolute: 0.3 10*3/uL (ref 0.1–1.0)
Neutro Abs: 1.6 10*3/uL (ref 1.4–7.7)
Neutrophils Relative %: 37.1 % — ABNORMAL LOW (ref 43.0–77.0)
Platelets: 253 10*3/uL (ref 150.0–400.0)
RBC: 4.16 Mil/uL (ref 3.87–5.11)
RDW: 13.5 % (ref 11.5–15.5)
WBC: 4.4 10*3/uL (ref 4.0–10.5)

## 2015-10-01 LAB — POC URINALSYSI DIPSTICK (AUTOMATED)
Bilirubin, UA: NEGATIVE
Blood, UA: NEGATIVE
GLUCOSE UA: NEGATIVE
KETONES UA: NEGATIVE
Leukocytes, UA: NEGATIVE
Nitrite, UA: NEGATIVE
PROTEIN UA: NEGATIVE
SPEC GRAV UA: 1.02
Urobilinogen, UA: 0.2
pH, UA: 6

## 2015-10-01 LAB — HEPATIC FUNCTION PANEL
ALT: 13 U/L (ref 0–35)
AST: 15 U/L (ref 0–37)
Albumin: 4.1 g/dL (ref 3.5–5.2)
Alkaline Phosphatase: 69 U/L (ref 39–117)
BILIRUBIN DIRECT: 0.1 mg/dL (ref 0.0–0.3)
TOTAL PROTEIN: 7.8 g/dL (ref 6.0–8.3)
Total Bilirubin: 0.3 mg/dL (ref 0.2–1.2)

## 2015-10-01 LAB — TSH: TSH: 0.4 u[IU]/mL (ref 0.35–4.50)

## 2015-10-07 ENCOUNTER — Ambulatory Visit (INDEPENDENT_AMBULATORY_CARE_PROVIDER_SITE_OTHER): Payer: BC Managed Care – PPO | Admitting: Family Medicine

## 2015-10-07 ENCOUNTER — Encounter: Payer: Self-pay | Admitting: Family Medicine

## 2015-10-07 VITALS — BP 120/84 | Temp 98.0°F | Ht 64.0 in | Wt 187.0 lb

## 2015-10-07 DIAGNOSIS — G441 Vascular headache, not elsewhere classified: Secondary | ICD-10-CM

## 2015-10-07 DIAGNOSIS — G44029 Chronic cluster headache, not intractable: Secondary | ICD-10-CM | POA: Diagnosis not present

## 2015-10-07 MED ORDER — TOPIRAMATE 100 MG PO TABS: 100.0000 mg | ORAL_TABLET | Freq: Two times a day (BID) | ORAL | Status: DC

## 2015-10-07 MED ORDER — RIZATRIPTAN BENZOATE 5 MG PO TBDP: 5.0000 mg | ORAL_TABLET | ORAL | Status: DC | PRN

## 2015-10-07 NOTE — Progress Notes (Signed)
Pre visit review using our clinic review tool, if applicable. No additional management support is needed unless otherwise documented below in the visit note. 

## 2015-10-07 NOTE — Progress Notes (Signed)
   Subjective:    Patient ID: Evelyn Edwards, female    DOB: 12/31/1960, 55 y.o.   MRN: [id number]  HPI Evelyn Edwards is a 55 year old married female nonsmoker who comes in today for general physical examination  She's always been in excellent health except for migraine headaches. We started her on Topamax 50 mg daily and that has relieved her migraines but only about 50%. She's having about 4 migraines a month  She gets routine eye care, no dental care. Referred to Dr. Alvester MorinBell.. Does BSE monthly and you mammography. Colonoscopy 2015 normal  Vaccinations up-to-date  Evelyn Edwards uterus removed many years ago for fibroids. Ovaries were left intact. She is asymptomatic GYN wise. No bloating etc. Etc.  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   HENT: Negative.   Eyes: Negative.   Respiratory: Negative.   Cardiovascular: Negative.   Gastrointestinal: Negative.   Endocrine: Negative.   Genitourinary: Negative.   Musculoskeletal: Negative.   Skin: Negative.   Allergic/Immunologic: Negative.   Neurological: Negative.   Hematological: Negative.   Psychiatric/Behavioral: Negative.        Objective:   Physical Exam  Constitutional: She appears well-developed and well-nourished.  HENT:  Head: Normocephalic and atraumatic.  Right Ear: External ear normal.  Left Ear: External ear normal.  Nose: Nose normal.  Mouth/Throat: Oropharynx is clear and moist.  Eyes: EOM are normal. Pupils are equal, round, and reactive to light.  Neck: Normal range of motion. Neck supple. No JVD present. No tracheal deviation present. No thyromegaly present.  Cardiovascular: Normal rate, regular rhythm, normal heart sounds and intact distal pulses.  Exam reveals no gallop and no friction rub.   No murmur heard. Pulmonary/Chest: Effort normal and breath sounds normal. No stridor. No respiratory distress. She has  no wheezes. She has no rales. She exhibits no tenderness.  Abdominal: Soft. Bowel sounds are normal. She exhibits no distension and no mass. There is no tenderness. There is no rebound and no guarding.  Genitourinary:  Bilateral breast exam normal  Musculoskeletal: Normal range of motion.  Lymphadenopathy:    She has no cervical adenopathy.  Neurological: She is alert. She has normal reflexes. No cranial nerve deficit. She exhibits normal muscle tone. Coordination normal.  Skin: Skin is warm and dry. No rash noted. No erythema. No pallor.  Psychiatric: She has a normal mood and affect. Her behavior is normal. Judgment and thought content normal.  Nursing note and vitals reviewed.         Assessment & Plan:

## 2015-10-07 NOTE — Patient Instructions (Addendum)
Topamax 100.......... One tablet daily  Maxalt when necessary  If you don't see any improvement in your frequency and severity of her headaches by September call  Call in October for your physical examination in May......... Kandee Keenory or Raynelle FanningJulie are 2 new adult nurse practitioner for Dr. [place]

## 2015-11-02 ENCOUNTER — Other Ambulatory Visit: Payer: Self-pay | Admitting: Family Medicine

## 2015-11-05 ENCOUNTER — Ambulatory Visit (INDEPENDENT_AMBULATORY_CARE_PROVIDER_SITE_OTHER): Payer: BC Managed Care – PPO | Admitting: Adult Health

## 2015-11-05 ENCOUNTER — Encounter: Payer: Self-pay | Admitting: Adult Health

## 2015-11-05 VITALS — BP 142/70 | Temp 98.6°F | Wt 185.6 lb

## 2015-11-05 DIAGNOSIS — L259 Unspecified contact dermatitis, unspecified cause: Secondary | ICD-10-CM

## 2015-11-05 MED ORDER — TRIAMCINOLONE ACETONIDE 0.1 % EX CREA
1.0000 | TOPICAL_CREAM | Freq: Two times a day (BID) | CUTANEOUS | Status: DC
Start: 2015-11-05 — End: 2022-09-27

## 2015-11-05 NOTE — Progress Notes (Signed)
   Subjective:    Patient ID: Kandy GarrisonPamela R Scatena, female    DOB: 07/24/1960, 55 y.o.   MRN: [id number]  HPI  55 year old female who presents to the office for two days of red, itchy rash on bilateral arms. She reports that the rash and itching started two days ago on the right arm and has since spread to the left. She started putting hydrocortisone cream on it today and this has helped with the itching and redness.     Review of Systems  Constitutional: Negative.   Skin: Positive for rash.  All other systems reviewed and are negative.  Past Medical History  Diagnosis Date  . Headache(784.0)   . Anxiety     Social History   Social History  . Marital Status: Single    Spouse Name: N/A  . Number of Children: N/A  . Years of Education: N/A   Occupational History  . Not on file.   Social History Main Topics  . Smoking status: Never Smoker   . Smokeless tobacco: Not on file  . Alcohol Use: No  . Drug Use: No  . Sexual Activity: Not on file   Other Topics Concern  . Not on file   Social History Narrative    Past Surgical History  Procedure Laterality Date  . Abdominal hysterectomy      ovaries were left intact    Family History  Problem Relation Age of Onset  . Hypertension Father   . Diabetes Father   . Coronary artery disease Father   . Colon polyps Father   . Asthma Mother   . Lupus Sister   . Anxiety disorder    . Colon polyps Father     No Known Allergies  Current Outpatient Prescriptions on File Prior to Visit  Medication Sig Dispense Refill  . HYDROcodone-homatropine (HYCODAN) 5-1.5 MG/5ML syrup 1/2-1 teaspoon at bedtime when necessary for cough and cold 240 mL 0  . rizatriptan (MAXALT-MLT) 5 MG disintegrating tablet Take 1 tablet (5 mg total) by mouth as needed for migraine. May repeat in 2 hours if needed 10 tablet 10  . topiramate (TOPAMAX) 100 MG tablet Take 1 tablet (100 mg total) by mouth 2 (two) times daily. 100 tablet 4  . traMADol (ULTRAM) 50  MG tablet take 1 tablet by mouth every 8 hours if needed 30 tablet 5   No current facility-administered medications on file prior to visit.    BP 142/70 mmHg  Temp(Src) 98.6 F (37 C) (Oral)  Wt 185 lb 9.6 oz (84.188 kg)        Objective:   Physical Exam  Constitutional: She is oriented to person, place, and time. She appears well-developed and well-nourished. No distress.  Neurological: She is alert and oriented to person, place, and time.  Skin: Skin is warm and dry. Rash (very small red bumps on bilateral hands. Do not appear vesicular. No drainage or discharge ) noted.  Psychiatric: She has a normal mood and affect. Her behavior is normal. Judgment and thought content normal.  Vitals reviewed.      Assessment & Plan:  1. Contact dermatitis - Appears as a plant allergy. Does not appear as scabies or poison ivy.  - triamcinolone cream (KENALOG) 0.1 %; Apply 1 application topically 2 (two) times daily.  Dispense: 30 g; Refill: 2 - Can take benadryl for itching - Follow up if no improvement Shirline Freesory Windy Dudek, NP

## 2015-11-05 NOTE — Patient Instructions (Addendum)
It was great meeting you today!  It appears as though you have a rash from contact dermatitis   I have sent in a prescription for Kenalog cream. This is a stronger hydrocortisone cream. Apply twice a day until the rash and itching is gone.   You can also use Benadryl cream in between applications   Follow up if no improvement  Contact Dermatitis Dermatitis is redness, soreness, and swelling (inflammation) of the skin. Contact dermatitis is a reaction to certain substances that touch the skin. There are two types of contact dermatitis:   Irritant contact dermatitis. This type is caused by something that irritates your skin, such as dry hands from washing them too much. This type does not require previous exposure to the substance for a reaction to occur. This type is more common.  Allergic contact dermatitis. This type is caused by a substance that you are allergic to, such as a nickel allergy or poison ivy. This type only occurs if you have been exposed to the substance (allergen) before. Upon a repeat exposure, your body reacts to the substance. This type is less common. CAUSES  Many different substances can cause contact dermatitis. Irritant contact dermatitis is most commonly caused by exposure to:   Makeup.   Soaps.   Detergents.   Bleaches.   Acids.   Metal salts, such as nickel.  Allergic contact dermatitis is most commonly caused by exposure to:   Poisonous plants.   Chemicals.   Jewelry.   Latex.   Medicines.   Preservatives in products, such as clothing.  RISK FACTORS This condition is more likely to develop in:   People who have jobs that expose them to irritants or allergens.  People who have certain medical conditions, such as asthma or eczema.  SYMPTOMS  Symptoms of this condition may occur anywhere on your body where the irritant has touched you or is touched by you. Symptoms include:  Dryness or flaking.   Redness.   Cracks.    Itching.   Pain or a burning feeling.   Blisters.  Drainage of small amounts of blood or clear fluid from skin cracks. With allergic contact dermatitis, there may also be swelling in areas such as the eyelids, mouth, or genitals.  DIAGNOSIS  This condition is diagnosed with a medical history and physical exam. A patch skin test may be performed to help determine the cause. If the condition is related to your job, you may need to see an occupational medicine specialist. TREATMENT Treatment for this condition includes figuring out what caused the reaction and protecting your skin from further contact. Treatment may also include:   Steroid creams or ointments. Oral steroid medicines may be needed in more severe cases.  Antibiotics or antibacterial ointments, if a skin infection is present.  Antihistamine lotion or an antihistamine taken by mouth to ease itching.  A bandage (dressing). HOME CARE INSTRUCTIONS Skin Care  Moisturize your skin as needed.   Apply cool compresses to the affected areas.  Try taking a bath with:  Epsom salts. Follow the instructions on the packaging. You can get these at your local pharmacy or grocery store.  Baking soda. Pour a small amount into the bath as directed by your health care provider.  Colloidal oatmeal. Follow the instructions on the packaging. You can get this at your local pharmacy or grocery store.  Try applying baking soda paste to your skin. Stir water into baking soda until it reaches a paste-like consistency.  Do  not scratch your skin.  Bathe less frequently, such as every other day.  Bathe in lukewarm water. Avoid using hot water. Medicines  Take or apply over-the-counter and prescription medicines only as told by your health care provider.   If you were prescribed an antibiotic medicine, take or apply your antibiotic as told by your health care provider. Do not stop using the antibiotic even if your condition starts  to improve. General Instructions  Keep all follow-up visits as told by your health care provider. This is important.  Avoid the substance that caused your reaction. If you do not know what caused it, keep a journal to try to track what caused it. Write down:  What you eat.  What cosmetic products you use.  What you drink.  What you wear in the affected area. This includes jewelry.  If you were given a dressing, take care of it as told by your health care provider. This includes when to change and remove it. SEEK MEDICAL CARE IF:   Your condition does not improve with treatment.  Your condition gets worse.  You have signs of infection such as swelling, tenderness, redness, soreness, or warmth in the affected area.  You have a fever.  You have new symptoms. SEEK IMMEDIATE MEDICAL CARE IF:   You have a severe headache, neck pain, or neck stiffness.  You vomit.  You feel very sleepy.  You notice red streaks coming from the affected area.  Your bone or joint underneath the affected area becomes painful after the skin has healed.  The affected area turns darker.  You have difficulty breathing.   This information is not intended to replace advice given to you by your health care provider. Make sure you discuss any questions you have with your health care provider.   Document Released: 05/20/2000 Document Revised: 02/11/2015 Document Reviewed: 10/08/2014 Elsevier Interactive Patient Education [employer] Elsevier Inc.

## 2016-01-20 ENCOUNTER — Other Ambulatory Visit: Payer: Self-pay | Admitting: Family Medicine

## 2016-01-22 NOTE — Telephone Encounter (Signed)
Okay to refill? 

## 2016-04-03 ENCOUNTER — Other Ambulatory Visit: Payer: Self-pay | Admitting: Internal Medicine

## 2016-04-07 ENCOUNTER — Telehealth: Payer: Self-pay | Admitting: Family Medicine

## 2016-04-07 NOTE — Telephone Encounter (Signed)
Pt need new Rx for Tramadol   Pharm:  [facility]  [place]

## 2016-04-07 NOTE — Telephone Encounter (Signed)
Prescription called into pharmacy left voicemail.

## 2016-07-19 ENCOUNTER — Other Ambulatory Visit: Payer: Self-pay | Admitting: Internal Medicine

## 2016-07-19 ENCOUNTER — Telehealth: Payer: Self-pay | Admitting: Emergency Medicine

## 2016-07-19 NOTE — Telephone Encounter (Signed)
Pt needs to make an appointment to be seen if headaches haven't gotten better with the tramadol

## 2016-07-19 NOTE — Telephone Encounter (Signed)
Dr.Todd's pt  

## 2016-07-20 NOTE — Telephone Encounter (Signed)
Pt advised she needs to be seen.  Appt scheduled 07/25/16.

## 2016-07-25 ENCOUNTER — Ambulatory Visit: Payer: BC Managed Care – PPO | Admitting: Family Medicine

## 2016-07-27 ENCOUNTER — Ambulatory Visit (INDEPENDENT_AMBULATORY_CARE_PROVIDER_SITE_OTHER): Payer: BC Managed Care – PPO | Admitting: Family Medicine

## 2016-07-27 ENCOUNTER — Encounter: Payer: Self-pay | Admitting: Family Medicine

## 2016-07-27 VITALS — BP 140/74 | HR 71 | Temp 97.7°F | Ht 64.0 in | Wt 177.5 lb

## 2016-07-27 DIAGNOSIS — G441 Vascular headache, not elsewhere classified: Secondary | ICD-10-CM | POA: Diagnosis not present

## 2016-07-27 DIAGNOSIS — G44029 Chronic cluster headache, not intractable: Secondary | ICD-10-CM

## 2016-07-27 MED ORDER — TOPIRAMATE 100 MG PO TABS: 100.0000 mg | ORAL_TABLET | Freq: Two times a day (BID) | ORAL | 4 refills | Status: DC

## 2016-07-27 MED ORDER — PREDNISONE 20 MG PO TABS: ORAL_TABLET | ORAL | 1 refills | Status: DC

## 2016-07-27 MED ORDER — RIZATRIPTAN BENZOATE 5 MG PO TBDP: 5.0000 mg | ORAL_TABLET | ORAL | 10 refills | Status: DC | PRN

## 2016-07-27 NOTE — Patient Instructions (Signed)
Topamax 100 mg........ one twice daily  Prednisone 20 mg............. 2 tabs for 3 days.............Marland Kitchen. 1 tab for 3 days....Marland Kitchen. A half a tab for 3 days then stop  Maxalt 5 mg............. keep one with you wherever you go and when you feel like a migraine might be starting........ take the Maxalt immediately..Marland Kitchen

## 2016-07-27 NOTE — Progress Notes (Signed)
Pre visit review using our clinic review tool, if applicable. No additional management support is needed unless otherwise documented below in the visit note. 

## 2016-07-27 NOTE — Progress Notes (Signed)
Evelyn Edwards is a 56 year old female who comes in today for evaluation of migraine headaches  She has a history of chronic migraine headaches they use the been well controlled with Topamax 100 mg twice a day and Maxalt when necessary sublingual 5 mg  Couple months ago she went down on the Topamax to 100 mg once daily because she read an article about memory loss with this medication. She herself had no symptoms. Saturday and Sunday she felt well Monday she woke up with a migraine. The Maxalt didn't help. Her head hurt all day long. She woke up Tuesday morning felt okay but the headache started around 2 PM. She didn't take the Maxalt because she didn't have any with her.  In the past she's had these cluster type migraines with change in temperature and seasonal variation. Dietary review of systems negative  BP 140/74 (BP Location: Left Arm, Patient Position: Sitting, Cuff Size: Normal)   Pulse 71   Temp 97.7 F (36.5 C) (Oral)   Ht 5\' 4"  (1.626 m)   Wt 177 lb 8 oz (80.5 kg)   BMI 30.47 kg/m  Examination of the HEENT were negative  #1 cluster migraines.......... increase Topamax back to 100 mg twice a day..... Short course of prednisone...Marland Kitchen.Marland Kitchen.Marland Kitchen. encouraged her to take the Maxalt stat with any future signs that the migraine might be start taking

## 2016-08-13 ENCOUNTER — Ambulatory Visit (INDEPENDENT_AMBULATORY_CARE_PROVIDER_SITE_OTHER): Payer: BC Managed Care – PPO | Admitting: Family Medicine

## 2016-08-13 DIAGNOSIS — R059 Cough, unspecified: Secondary | ICD-10-CM

## 2016-08-13 DIAGNOSIS — R05 Cough: Secondary | ICD-10-CM | POA: Diagnosis not present

## 2016-08-13 MED ORDER — HYDROCOD POLST-CPM POLST ER 10-8 MG/5ML PO SUER: 5.0000 mL | Freq: Two times a day (BID) | ORAL | 0 refills | Status: DC | PRN

## 2016-08-13 NOTE — Progress Notes (Signed)
Pre visit review using our clinic review tool, if applicable. No additional management support is needed unless otherwise documented below in the visit note. 

## 2016-08-13 NOTE — Patient Instructions (Signed)
This appears to be viral.  Take the cough medication as needed and rest.  If you worsen or fail to improve, please let us know.  Take care  Dr. Adriana Simasook

## 2016-08-13 NOTE — Progress Notes (Signed)
   Subjective:  Patient ID: Evelyn GarrisonPamela R Edwards, female    DOB: 10/02/1960  Age: 56 y.o. MRN: [id number]  CC: Cough  HPI:  56 year old female presents with complaints of cough.  Patient states she's had cough 2 days. Slightly productive. Associated chills. No fever. She's had some recent diarrhea as well. No known exacerbating or relieving factors. No other associated symptoms. No other complaints or concerns at this time.  Social Hx   Social History   Social History  . Marital status: Single    Spouse name: N/A  . Number of children: N/A  . Years of education: N/A   Social History Main Topics  . Smoking status: Never Smoker  . Smokeless tobacco: Not on file  . Alcohol use No  . Drug use: No  . Sexual activity: Not on file   Other Topics Concern  . Not on file   Social History Narrative  . No narrative on file    Review of Systems  Constitutional: Positive for chills.  Respiratory: Positive for cough.    Objective:  BP 136/68   Pulse 80   Temp 98 F (36.7 C) (Oral)   Wt 176 lb (79.8 kg)   SpO2 99%   BMI 30.21 kg/m   BP/Weight 08/13/2016 07/27/2016 11/05/2015  Systolic BP 136 140 142  Diastolic BP 68 74 70  Wt. (Lbs) 176 177.5 185.6  BMI 30.21 30.47 31.84   Physical Exam  Constitutional: She is oriented to person, place, and time. She appears well-developed. No distress.  HENT:  Mouth/Throat: Oropharynx is clear and moist.  Cardiovascular: Normal rate and regular rhythm.   Pulmonary/Chest: Effort normal and breath sounds normal.  Neurological: She is alert and oriented to person, place, and time.  Psychiatric: She has a normal mood and affect.  Vitals reviewed.  Lab Results  Component Value Date   WBC 4.4 10/01/2015   HGB 12.4 10/01/2015   HCT 37.0 10/01/2015   PLT 253.0 10/01/2015   GLUCOSE 88 10/01/2015   CHOL 174 10/01/2015   TRIG 80.0 10/01/2015   HDL 50.90 10/01/2015   LDLCALC 107 (H) 10/01/2015   ALT 13 10/01/2015   AST 15 10/01/2015   NA 140  10/01/2015   K 4.0 10/01/2015   CL 107 10/01/2015   CREATININE 0.84 10/01/2015   BUN 22 10/01/2015   CO2 26 10/01/2015   TSH 0.40 10/01/2015    Assessment & Plan:   Problem List Items Addressed This Visit    Cough    New acute problem. Exam negative. Treating with Tussionex.         Meds ordered this encounter  Medications  . chlorpheniramine-HYDROcodone (TUSSIONEX PENNKINETIC ER) 10-8 MG/5ML SUER    Sig: Take 5 mLs by mouth every 12 (twelve) hours as needed.    Dispense:  115 mL    Refill:  0    Follow-up: PRN  Everlene OtherJayce Secret Kristensen DO [facility]

## 2016-08-13 NOTE — Assessment & Plan Note (Signed)
New acute problem. Exam negative. Treating with Tussionex.

## 2016-08-29 ENCOUNTER — Other Ambulatory Visit: Payer: Self-pay | Admitting: Family Medicine

## 2016-08-30 NOTE — Telephone Encounter (Signed)
Todd printed.  Faxed.

## 2016-09-29 ENCOUNTER — Other Ambulatory Visit: Payer: Self-pay | Admitting: Family Medicine

## 2016-09-29 NOTE — Telephone Encounter (Signed)
Dr. Tawanna Cooler not in the office this week or next.  Will forward to another provider.

## 2016-10-04 ENCOUNTER — Other Ambulatory Visit: Payer: Self-pay | Admitting: Family Medicine

## 2016-10-05 NOTE — Telephone Encounter (Signed)
Called in and given to pharmacy technician for #30 only.  0 Refills.

## 2016-11-01 ENCOUNTER — Other Ambulatory Visit: Payer: Self-pay | Admitting: Family Medicine

## 2016-11-02 NOTE — Telephone Encounter (Signed)
Waiting for authorization from Dr. Tawanna Coolerodd.

## 2016-11-03 NOTE — Telephone Encounter (Signed)
Per Dr. Tawanna Coolerodd, ok to call in #30 with 2 refills.  Called to the pharmacy and left on voicemail.

## 2017-01-16 ENCOUNTER — Other Ambulatory Visit: Payer: Self-pay | Admitting: Family Medicine

## 2017-01-16 NOTE — Telephone Encounter (Signed)
Evelyn Edwards   Could you forward this refill request to whomever is covering Dr. Nelida Meuseodd's box while he is unavailable

## 2017-01-16 NOTE — Telephone Encounter (Signed)
Okay for refill?  

## 2017-01-16 NOTE — Telephone Encounter (Signed)
Dr. Kirtland BouchardK  Could you advise in Dr. Nelida Meuseodd's absence if ok to refill  Last filled 11/03/16 Qty #30 RF #2 Last Ov 07/27/16

## 2017-01-17 NOTE — Telephone Encounter (Signed)
Harriett SineNancy, please see Dr. Charm RingsK's message. I am currently at another practice. Thanks

## 2017-01-17 NOTE — Telephone Encounter (Signed)
Rx for Tramadol was phoned into pt pharmacy.

## 2017-02-20 ENCOUNTER — Other Ambulatory Visit: Payer: Self-pay | Admitting: Internal Medicine

## 2017-02-21 NOTE — Telephone Encounter (Signed)
Dr.Todd Pt 

## 2017-02-24 ENCOUNTER — Other Ambulatory Visit: Payer: Self-pay

## 2017-02-24 ENCOUNTER — Telehealth: Payer: Self-pay | Admitting: Family Medicine

## 2017-02-24 MED ORDER — TRAMADOL HCL 50 MG PO TABS: ORAL_TABLET | ORAL | 2 refills | Status: DC

## 2017-02-24 NOTE — Telephone Encounter (Signed)
Rx has been printed waiting for signature from the doctor

## 2017-02-24 NOTE — Telephone Encounter (Signed)
° ° ° ° ° °  Pt request refill of the following: ° ° °traMADol (ULTRAM) 50 MG tablet ° ° °Phamacy: °

## 2017-02-24 NOTE — Telephone Encounter (Signed)
Rx was phoned in to patients pharmacy. 

## 2017-03-01 NOTE — Telephone Encounter (Signed)
Rx was signed and phoned into pt pharmacy

## 2017-05-09 ENCOUNTER — Other Ambulatory Visit: Payer: Self-pay | Admitting: Family Medicine

## 2017-06-30 ENCOUNTER — Other Ambulatory Visit: Payer: Self-pay | Admitting: Family Medicine

## 2017-07-03 ENCOUNTER — Telehealth: Payer: Self-pay

## 2017-07-03 NOTE — Telephone Encounter (Signed)
Pt Rx for Tramadol has been printed signed and phoned in to pt pharmacy

## 2017-07-04 ENCOUNTER — Ambulatory Visit: Payer: BC Managed Care – PPO | Admitting: Family Medicine

## 2017-07-04 ENCOUNTER — Encounter: Payer: Self-pay | Admitting: Family Medicine

## 2017-07-04 VITALS — BP 120/82 | HR 78 | Temp 98.2°F | Wt 165.0 lb

## 2017-07-04 DIAGNOSIS — G44029 Chronic cluster headache, not intractable: Secondary | ICD-10-CM | POA: Diagnosis not present

## 2017-07-04 DIAGNOSIS — G441 Vascular headache, not elsewhere classified: Secondary | ICD-10-CM | POA: Diagnosis not present

## 2017-07-04 DIAGNOSIS — R634 Abnormal weight loss: Secondary | ICD-10-CM

## 2017-07-04 LAB — CBC WITH DIFFERENTIAL/PLATELET
Basophils Absolute: 0 10*3/uL (ref 0.0–0.1)
Basophils Relative: 0.6 % (ref 0.0–3.0)
EOS PCT: 1 % (ref 0.0–5.0)
Eosinophils Absolute: 0 10*3/uL (ref 0.0–0.7)
HCT: 40 % (ref 36.0–46.0)
Hemoglobin: 13.1 g/dL (ref 12.0–15.0)
LYMPHS ABS: 1.9 10*3/uL (ref 0.7–4.0)
Lymphocytes Relative: 46.7 % — ABNORMAL HIGH (ref 12.0–46.0)
MCHC: 32.9 g/dL (ref 30.0–36.0)
MCV: 91.5 fl (ref 78.0–100.0)
MONO ABS: 0.3 10*3/uL (ref 0.1–1.0)
MONOS PCT: 6.5 % (ref 3.0–12.0)
NEUTROS PCT: 45.2 % (ref 43.0–77.0)
Neutro Abs: 1.9 10*3/uL (ref 1.4–7.7)
Platelets: 241 10*3/uL (ref 150.0–400.0)
RBC: 4.37 Mil/uL (ref 3.87–5.11)
RDW: 12.8 % (ref 11.5–15.5)
WBC: 4.1 10*3/uL (ref 4.0–10.5)

## 2017-07-04 LAB — POCT URINALYSIS DIPSTICK
BILIRUBIN UA: NEGATIVE
GLUCOSE UA: NEGATIVE
Ketones, UA: NEGATIVE
Leukocytes, UA: NEGATIVE
Nitrite, UA: NEGATIVE
Protein, UA: NEGATIVE
RBC UA: NEGATIVE
SPEC GRAV UA: 1.015 (ref 1.010–1.025)
Urobilinogen, UA: 0.2 E.U./dL
pH, UA: 6 (ref 5.0–8.0)

## 2017-07-04 LAB — BASIC METABOLIC PANEL
BUN: 14 mg/dL (ref 6–23)
CHLORIDE: 108 meq/L (ref 96–112)
CO2: 26 mEq/L (ref 19–32)
Calcium: 9.3 mg/dL (ref 8.4–10.5)
Creatinine, Ser: 0.95 mg/dL (ref 0.40–1.20)
GFR: 77.99 mL/min (ref >60.00)
Glucose, Bld: 96 mg/dL (ref 70–99)
POTASSIUM: 3.9 meq/L (ref 3.5–5.1)
Sodium: 142 mEq/L (ref 135–145)

## 2017-07-04 LAB — TSH: TSH: 0.43 u[IU]/mL (ref 0.35–4.50)

## 2017-07-04 LAB — HEPATIC FUNCTION PANEL
ALBUMIN: 4.1 g/dL (ref 3.5–5.2)
ALK PHOS: 66 U/L (ref 39–117)
ALT: 12 U/L (ref 0–35)
AST: 13 U/L (ref 0–37)
BILIRUBIN DIRECT: 0 mg/dL (ref 0.0–0.3)
BILIRUBIN TOTAL: 0.2 mg/dL (ref 0.2–1.2)
Total Protein: 7.3 g/dL (ref 6.0–8.3)

## 2017-07-04 MED ORDER — TOPIRAMATE 100 MG PO TABS: 100.0000 mg | ORAL_TABLET | Freq: Two times a day (BID) | ORAL | 4 refills | Status: DC

## 2017-07-04 MED ORDER — TOPIRAMATE 25 MG PO TABS: ORAL_TABLET | ORAL | 4 refills | Status: DC

## 2017-07-04 MED ORDER — RIZATRIPTAN BENZOATE 5 MG PO TBDP: 5.0000 mg | ORAL_TABLET | ORAL | 10 refills | Status: DC | PRN

## 2017-07-04 MED ORDER — PREDNISONE 20 MG PO TABS: ORAL_TABLET | ORAL | 1 refills | Status: DC

## 2017-07-04 NOTE — Progress Notes (Signed)
Evelyn Edwards is a delightful 57 year old female nonsmoker who comes in today concerned about 2 issues  She has history migraine headaches is been on Topamax 100 mg daily however recently the migraines have start increase in frequency and severity. She now gets 1 or 2 week. Sometimes she wakes up with them in the [place] doesn't help.  She changed her died about a year ago and is lost 12 pounds in the last 12 months. She also feels tired and fatigued. She wonders if there something metabolically wrong. She has a history of thyroid disease in her family.  14 point review of systems reviewed otherwise negative  BP 120/82 (BP Location: Left Arm, Patient Position: Sitting, Cuff Size: Normal)   Pulse 78   Temp 98.2 F (36.8 C) (Oral)   Wt 165 lb (74.8 kg)   BMI 28.32 kg/m  Well-developed well-nourished female no acute distress HEENT were negative thyroid is not enlarged cardiopulmonary exam normal abdominal exam normal  #1 migraine headaches......... Increasing in frequency and severity........ Increase Topamax  #2 weight loss voluntarily...Marland Kitchen.Marland Kitchen.Marland Kitchen. Check labs

## 2017-07-04 NOTE — Patient Instructions (Signed)
Topamax 100 mg twice a day,,,,,,,,,,,,,,,,,,,,,add Topamax 25 mg...Marland Kitchen.Marland Kitchen.Marland Kitchen. 1 at bedtime  Labs today........... I will call you if there is anything abnormal. If you increase the Topamax and your migraines decrease in severity and frequency then continue that program if not take a short course of prednisone

## 2017-09-26 ENCOUNTER — Other Ambulatory Visit: Payer: Self-pay | Admitting: Family Medicine

## 2017-09-26 DIAGNOSIS — Z1231 Encounter for screening mammogram for malignant neoplasm of breast: Secondary | ICD-10-CM

## 2017-09-27 ENCOUNTER — Ambulatory Visit
Admission: RE | Admit: 2017-09-27 | Discharge: 2017-09-27 | Disposition: A | Discharge status: Home / Self Care | Payer: BC Managed Care – PPO | Source: Ambulatory Visit | Attending: Family Medicine | Admitting: Family Medicine

## 2017-09-27 DIAGNOSIS — Z1231 Encounter for screening mammogram for malignant neoplasm of breast: Secondary | ICD-10-CM

## 2017-10-04 ENCOUNTER — Encounter: Payer: Self-pay | Admitting: Family Medicine

## 2017-10-04 ENCOUNTER — Ambulatory Visit: Payer: BC Managed Care – PPO | Admitting: Family Medicine

## 2017-10-04 VITALS — BP 142/72 | HR 87 | Temp 98.3°F | Wt 168.0 lb

## 2017-10-04 DIAGNOSIS — K529 Noninfective gastroenteritis and colitis, unspecified: Secondary | ICD-10-CM

## 2017-10-04 DIAGNOSIS — J069 Acute upper respiratory infection, unspecified: Secondary | ICD-10-CM

## 2017-10-04 DIAGNOSIS — B9789 Other viral agents as the cause of diseases classified elsewhere: Secondary | ICD-10-CM

## 2017-10-04 MED ORDER — PREDNISONE 10 MG PO TABS: ORAL_TABLET | ORAL | 0 refills | Status: DC

## 2017-10-04 MED ORDER — HYDROCODONE-HOMATROPINE 5-1.5 MG/5ML PO SYRP: 5.0000 mL | ORAL_SOLUTION | Freq: Three times a day (TID) | ORAL | 0 refills | Status: DC | PRN

## 2017-10-04 NOTE — Progress Notes (Signed)
Subjective:    Patient ID: Evelyn Edwards, female    DOB: Dec 02, 1960, 57 y.o.   MRN: [id number]  Chief Complaint  Patient presents with  . Cough    HPI Patient was seen today for acute concern.  Pt endorses dry cough, SOB, head/chest congestion, diarrhea since Sunday.  Pt states symptoms started after being in the emergency department in the hospital with her mother over the weekend.  Pt states symptoms have become progressively worse and she now has a sore throat and slightly hoarse voice.  Cough has been keeping pt up at night.  Pt denies fever, nausea, vomiting, chills, headache, ear pain or pressure.  Pt endorses decreased appetite but she has been drinking okay.  Pt tried over-the-counter medicine for her symptoms.  Past Medical History:  Diagnosis Date  . Anxiety   . Headache(784.0)     No Known Allergies  ROS General: Denies fever, chills, night sweats, changes in weight, changes in appetite  +decreased appetite HEENT: Denies headaches, ear pain, changes in vision, rhinorrhea  + sore throat, head congestion, hoarse voice CV: Denies CP, palpitations, orthopnea Pulm: Denies SOB, wheezing   +cough, SOB, chest congestion GI: Denies abdominal pain, nausea, vomiting, constipation  +diarrhea GU: Denies dysuria, hematuria, frequency, vaginal discharge Msk: Denies muscle cramps, joint pains Neuro: Denies weakness, numbness, tingling Skin: Denies rashes, bruising Psych: Denies depression, anxiety, hallucinations     Objective:    Blood pressure (!) 142/72, pulse 87, temperature 98.3 F (36.8 C), temperature source Oral, weight 168 lb (76.2 kg), SpO2 98 %.   Gen. Pleasant, well-nourished, in no distress, normal affect   HEENT: [place]/AT, face symmetric, no scleral icterus, PERRLA, nares patent without drainage, pharynx without erythema or exudate.  TMs normal bilaterally.  Mild cervical lymphadenopathy of L neck. Lungs: no accessory muscle use, faint wheezes in bases otherwise  clear Cardiovascular: RRR, no m/r/g, no peripheral edema Abdomen: BS present, soft, NT/ND Neuro:  A&Ox3, CN II-XII intact, normal gait   Wt Readings from Last 3 Encounters:  10/04/17 168 lb (76.2 kg)  07/04/17 165 lb (74.8 kg)  08/13/16 176 lb (79.8 kg)    Lab Results  Component Value Date   WBC 4.1 07/04/2017   HGB 13.1 07/04/2017   HCT 40.0 07/04/2017   PLT 241.0 07/04/2017   GLUCOSE 96 07/04/2017   CHOL 174 10/01/2015   TRIG 80.0 10/01/2015   HDL 50.90 10/01/2015   LDLCALC 107 (H) 10/01/2015   ALT 12 07/04/2017   AST 13 07/04/2017   NA 142 07/04/2017   K 3.9 07/04/2017   CL 108 07/04/2017   CREATININE 0.95 07/04/2017   BUN 14 07/04/2017   CO2 26 07/04/2017   TSH 0.43 07/04/2017    Assessment/Plan:  Viral URI with cough -supportive care  - Plan: HYDROcodone-homatropine (HYCODAN) 5-1.5 MG/5ML syrup, predniSONE (DELTASONE) 10 MG tablet  Gastroenteritis -supportive care.  Reminded to stay hydrated. -ok to take imodium prn -advance diet as tolerated. -given handout on foods that help relieve diarrhea -frequent handwashing encouraged.  Follow-up PRN if symptoms become worse  Abbe Amsterdam, MD

## 2017-10-04 NOTE — Patient Instructions (Addendum)
Food Choices to Help Relieve Diarrhea, Adult When you have diarrhea, the foods you eat and your eating habits are very important. Choosing the right foods and drinks can help:  Relieve diarrhea.  Replace lost fluids and nutrients.  Prevent dehydration.  What general guidelines should I follow? Relieving diarrhea  Choose foods with less than 2 g or .07 oz. of fiber per serving.  Limit fats to less than 8 tsp (38 g or 1.34 oz.) a day.  Avoid the following: ? Foods and beverages sweetened with high-fructose corn syrup, honey, or sugar alcohols such as xylitol, sorbitol, and mannitol. ? Foods that contain a lot of fat or sugar. ? Fried, greasy, or spicy foods. ? High-fiber grains, breads, and cereals. ? Raw fruits and vegetables.  Eat foods that are rich in probiotics. These foods include dairy products such as yogurt and fermented milk products. They help increase healthy bacteria in the stomach and intestines (gastrointestinal tract, or GI tract).  If you have lactose intolerance, avoid dairy products. These may make your diarrhea worse.  Take medicine to help stop diarrhea (antidiarrheal medicine) only as told by your health care provider. Replacing nutrients  Eat small meals or snacks every 3-4 hours.  Eat bland foods, such as white rice, toast, or baked potato, until your diarrhea starts to get better. Gradually reintroduce nutrient-rich foods as tolerated or as told by your health care provider. This includes: ? Well-cooked protein foods. ? Peeled, seeded, and soft-cooked fruits and vegetables. ? Low-fat dairy products.  Take vitamin and mineral supplements as told by your health care provider. Preventing dehydration   Start by sipping water or a special solution to prevent dehydration (oral rehydration solution, ORS). Urine that is clear or pale yellow means that you are getting enough fluid.  Try to drink at least 8-10 cups of fluid each day to help replace lost  fluids.  You may add other liquids in addition to water, such as clear juice or decaffeinated sports drinks, as tolerated or as told by your health care provider.  Avoid drinks with caffeine, such as coffee, tea, or soft drinks.  Avoid alcohol. What foods are recommended? The items listed may not be a complete list. Talk with your health care provider about what dietary choices are best for you. Grains White rice. White, French, or pita breads (fresh or toasted), including plain rolls, buns, or bagels. White pasta. Saltine, soda, or graham crackers. Pretzels. Low-fiber cereal. Cooked cereals made with water (such as cornmeal, farina, or cream cereals). Plain muffins. Matzo. Melba toast. Zwieback. Vegetables Potatoes (without the skin). Most well-cooked and canned vegetables without skins or seeds. Tender lettuce. Fruits Apple sauce. Fruits canned in juice. Cooked apricots, cherries, grapefruit, peaches, pears, or plums. Fresh bananas and cantaloupe. Meats and other protein foods Baked or boiled chicken. Eggs. Tofu. Fish. Seafood. Smooth nut butters. Ground or well-cooked tender beef, ham, veal, lamb, pork, or poultry. Dairy Plain yogurt, kefir, and unsweetened liquid yogurt. Lactose-free milk, buttermilk, skim milk, or soy milk. Low-fat or nonfat hard cheese. Beverages Water. Low-calorie sports drinks. Fruit juices without pulp. Strained tomato and vegetable juices. Decaffeinated teas. Sugar-free beverages not sweetened with sugar alcohols. Oral rehydration solutions, if approved by your health care provider. Seasoning and other foods Bouillon, broth, or soups made from recommended foods. What foods are not recommended? The items listed may not be a complete list. Talk with your health care provider about what dietary choices are best for you. Grains Whole grain, whole wheat,   bran, or rye breads, rolls, pastas, and crackers. Wild or brown rice. Whole grain or bran cereals. Barley. Oats and  oatmeal. Corn tortillas or taco shells. Granola. Popcorn. Vegetables Raw vegetables. Fried vegetables. Cabbage, broccoli, Brussels sprouts, artichokes, baked beans, beet greens, corn, kale, legumes, peas, sweet potatoes, and yams. Potato skins. Cooked spinach and cabbage. Fruits Dried fruit, including raisins and dates. Raw fruits. Stewed or dried prunes. Canned fruits with syrup. Meat and other protein foods Fried or fatty meats. Deli meats. Chunky nut butters. Nuts and seeds. Beans and lentils. Bacon. Hot dogs. Sausage. Dairy High-fat cheeses. Whole milk, chocolate milk, and beverages made with milk, such as milk shakes. Half-and-half. Cream. sour cream. Ice cream. Beverages Caffeinated beverages (such as coffee, tea, soda, or energy drinks). Alcoholic beverages. Fruit juices with pulp. Prune juice. Soft drinks sweetened with high-fructose corn syrup or sugar alcohols. High-calorie sports drinks. Fats and oils Butter. Cream sauces. Margarine. Salad oils. Plain salad dressings. Olives. Avocados. Mayonnaise. Sweets and desserts Sweet rolls, doughnuts, and sweet breads. Sugar-free desserts sweetened with sugar alcohols such as xylitol and sorbitol. Seasoning and other foods Honey. Hot sauce. Chili powder. Gravy. Cream-based or milk-based soups. Pancakes and waffles. Summary  When you have diarrhea, the foods you eat and your eating habits are very important.  Make sure you get at least 8-10 cups of fluid each day, or enough to keep your urine clear or pale yellow.  Eat bland foods and gradually reintroduce healthy, nutrient-rich foods as tolerated, or as told by your health care provider.  Avoid high-fiber, fried, greasy, or spicy foods. This information is not intended to replace advice given to you by your health care provider. Make sure you discuss any questions you have with your health care provider. Document Released: 08/13/2003 Document Revised: 05/20/2016 Document Reviewed:  05/20/2016 Elsevier Interactive Patient Education  2018 [employer]  Viral Gastroenteritis, Adult Viral gastroenteritis is also known as the stomach flu. This condition is caused by various viruses. These viruses can be passed from person to person very easily (are very contagious). This condition may affect your stomach, small intestine, and large intestine. It can cause sudden watery diarrhea, fever, and vomiting. Diarrhea and vomiting can make you feel weak and cause you to become dehydrated. You may not be able to keep fluids down. Dehydration can make you tired and thirsty, cause you to have a dry mouth, and decrease how often you urinate. Older adults and people with other diseases or a weak immune system are at higher risk for dehydration. It is important to replace the fluids that you lose from diarrhea and vomiting. If you become severely dehydrated, you may need to get fluids through an IV tube. What are the causes? Gastroenteritis is caused by various viruses, including rotavirus and norovirus. Norovirus is the most common cause in adults. You can get sick by eating food, drinking water, or touching a surface contaminated with one of these viruses. You can also get sick from sharing utensils or other personal items with an infected person. What increases the risk? This condition is more likely to develop in people:  Who have a weak defense system (immune system).  Who live with one or more children who are younger than 2 years old.  Who live in a nursing home.  Who go on cruise ships.  What are the signs or symptoms? Symptoms of this condition start suddenly 1-2 days after exposure to a virus. Symptoms may last a few days or as long as   a week. The most common symptoms are watery diarrhea and vomiting. Other symptoms include:  Fever.  Headache.  Fatigue.  Pain in the abdomen.  Chills.  Weakness.  Nausea.  Muscle aches.  Loss of appetite.  How is this  diagnosed? This condition is diagnosed with a medical history and physical exam. You may also have a stool test to check for viruses or other infections. How is this treated? This condition typically goes away on its own. The focus of treatment is to restore lost fluids (rehydration). Your health care provider may recommend that you take an oral rehydration solution (ORS) to replace important salts and minerals (electrolytes) in your body. Severe cases of this condition may require giving fluids through an IV tube. Treatment may also include medicine to help with your symptoms. Follow these instructions at home: Follow instructions from your health care provider about how to care for yourself at home. Eating and drinking Follow these recommendations as told by your health care provider:  Take an ORS. This is a drink that is sold at pharmacies and retail stores.  Drink clear fluids in small amounts as you are able. Clear fluids include water, ice chips, diluted fruit juice, and low-calorie sports drinks.  Eat bland, easy-to-digest foods in small amounts as you are able. These foods include bananas, applesauce, rice, lean meats, toast, and crackers.  Avoid fluids that contain a lot of sugar or caffeine, such as energy drinks, sports drinks, and soda.  Avoid alcohol.  Avoid spicy or fatty foods.  General instructions   Drink enough fluid to keep your urine clear or pale yellow.  Wash your hands often. If soap and water are not available, use hand sanitizer.  Make sure that all people in your household wash their hands well and often.  Take over-the-counter and prescription medicines only as told by your health care provider.  Rest at home while you recover.  Watch your condition for any changes.  Take a warm bath to relieve any burning or pain from frequent diarrhea episodes.  Keep all follow-up visits as told by your health care provider. This is important. Contact a health care  provider if:  You cannot keep fluids down.  Your symptoms get worse.  You have new symptoms.  You feel light-headed or dizzy.  You have muscle cramps. Get help right away if:  You have chest pain.  You feel extremely weak or you faint.  You see blood in your vomit.  Your vomit looks like coffee grounds.  You have bloody or black stools or stools that look like tar.  You have a severe headache, a stiff neck, or both.  You have a rash.  You have severe pain, cramping, or bloating in your abdomen.  You have trouble breathing or you are breathing very quickly.  Your heart is beating very quickly.  Your skin feels cold and clammy.  You feel confused.  You have pain when you urinate.  You have signs of dehydration, such as: ? Dark urine, very little urine, or no urine. ? Cracked lips. ? Dry mouth. ? Sunken eyes. ? Sleepiness. ? Weakness. This information is not intended to replace advice given to you by your health care provider. Make sure you discuss any questions you have with your health care provider. Document Released: 05/23/2005 Document Revised: 11/04/2015 Document Reviewed: 01/27/2015 Elsevier Interactive Patient Education  2018 [employer] Inc[employer]  

## 2018-02-05 ENCOUNTER — Other Ambulatory Visit: Payer: Self-pay | Admitting: Family Medicine

## 2018-02-06 NOTE — Telephone Encounter (Signed)
I would recommend she see a neurologist because of the increase in severity and frequency of her headaches

## 2018-03-06 ENCOUNTER — Other Ambulatory Visit: Payer: Self-pay | Admitting: Family Medicine

## 2018-04-03 ENCOUNTER — Other Ambulatory Visit: Payer: Self-pay | Admitting: Family Medicine

## 2018-04-04 ENCOUNTER — Encounter: Payer: BC Managed Care – PPO | Admitting: Family Medicine

## 2018-04-09 NOTE — Telephone Encounter (Signed)
Okay for refill? Please advise 

## 2018-04-10 ENCOUNTER — Other Ambulatory Visit: Payer: Self-pay | Admitting: Family Medicine

## 2018-04-10 DIAGNOSIS — G441 Vascular headache, not elsewhere classified: Secondary | ICD-10-CM

## 2018-04-20 ENCOUNTER — Encounter: Payer: Self-pay | Admitting: Family Medicine

## 2018-04-20 ENCOUNTER — Ambulatory Visit: Payer: BC Managed Care – PPO | Admitting: Family Medicine

## 2018-04-20 VITALS — BP 108/78 | HR 62 | Temp 98.2°F | Wt 167.0 lb

## 2018-04-20 DIAGNOSIS — Z8669 Personal history of other diseases of the nervous system and sense organs: Secondary | ICD-10-CM | POA: Diagnosis not present

## 2018-04-20 MED ORDER — RIZATRIPTAN BENZOATE 5 MG PO TBDP: ORAL_TABLET | ORAL | 6 refills | Status: DC

## 2018-04-20 MED ORDER — TRAMADOL HCL 50 MG PO TABS: 50.0000 mg | ORAL_TABLET | Freq: Three times a day (TID) | ORAL | 0 refills | Status: DC | PRN

## 2018-04-20 NOTE — Patient Instructions (Signed)
Migraine Headache A migraine headache is an intense, throbbing pain on one side or both sides of the head. Migraines may also cause other symptoms, such as nausea, vomiting, and sensitivity to light and noise. What are the causes? Doing or taking certain things may also trigger migraines, such as:  Alcohol.  Smoking.  Medicines, such as: ? Medicine used to treat chest pain (nitroglycerine). ? Birth control pills. ? Estrogen pills. ? Certain blood pressure medicines.  Aged cheeses, chocolate, or caffeine.  Foods or drinks that contain nitrates, glutamate, aspartame, or tyramine.  Physical activity.  Other things that may trigger a migraine include:  Menstruation.  Pregnancy.  Hunger.  Stress, lack of sleep, too much sleep, or fatigue.  Weather changes.  What increases the risk? The following factors may make you more likely to experience migraine headaches:  Age. Risk increases with age.  Family history of migraine headaches.  Being Caucasian.  Depression and anxiety.  Obesity.  Being a woman.  Having a hole in the heart (patent foramen ovale) or other heart problems.  What are the signs or symptoms? The main symptom of this condition is pulsating or throbbing pain. Pain may:  Happen in any area of the head, such as on one side or both sides.  Interfere with daily activities.  Get worse with physical activity.  Get worse with exposure to bright lights or loud noises.  Other symptoms may include:  Nausea.  Vomiting.  Dizziness.  General sensitivity to bright lights, loud noises, or smells.  Before you get a migraine, you may get warning signs that a migraine is developing (aura). An aura may include:  Seeing flashing lights or having blind spots.  Seeing bright spots, halos, or zigzag lines.  Having tunnel vision or blurred vision.  Having numbness or a tingling feeling.  Having trouble talking.  Having muscle weakness.  How is this  diagnosed? A migraine headache can be diagnosed based on:  Your symptoms.  A physical exam.  Tests, such as CT scan or MRI of the head. These imaging tests can help rule out other causes of headaches.  Taking fluid from the spine (lumbar puncture) and analyzing it (cerebrospinal fluid analysis, or CSF analysis).  How is this treated? A migraine headache is usually treated with medicines that:  Relieve pain.  Relieve nausea.  Prevent migraines from coming back.  Treatment may also include:  Acupuncture.  Lifestyle changes like avoiding foods that trigger migraines.  Follow these instructions at home: Medicines  Take over-the-counter and prescription medicines only as told by your health care provider.  Do not drive or use heavy machinery while taking prescription pain medicine.  To prevent or treat constipation while you are taking prescription pain medicine, your health care provider may recommend that you: ? Drink enough fluid to keep your urine clear or pale yellow. ? Take over-the-counter or prescription medicines. ? Eat foods that are high in fiber, such as fresh fruits and vegetables, whole grains, and beans. ? Limit foods that are high in fat and processed sugars, such as fried and sweet foods. Lifestyle  Avoid alcohol use.  Do not use any products that contain nicotine or tobacco, such as cigarettes and e-cigarettes. If you need help quitting, ask your health care provider.  Get at least 8 hours of sleep every night.  Limit your stress. General instructions   Keep a journal to find out what may trigger your migraine headaches. For example, write down: ? What you eat and   drink. ? How much sleep you get. ? Any change to your diet or medicines.  If you have a migraine: ? Avoid things that make your symptoms worse, such as bright lights. ? It may help to lie down in a dark, quiet room. ? Do not drive or use heavy machinery. ? Ask your health care provider  what activities are safe for you while you are experiencing symptoms.  Keep all follow-up visits as told by your health care provider. This is important. Contact a health care provider if:  You develop symptoms that are different or more severe than your usual migraine symptoms. Get help right away if:  Your migraine becomes severe.  You have a fever.  You have a stiff neck.  You have vision loss.  Your muscles feel weak or like you cannot control them.  You start to lose your balance often.  You develop trouble walking.  You faint. This information is not intended to replace advice given to you by your health care provider. Make sure you discuss any questions you have with your health care provider. Document Released: 05/23/2005 Document Revised: 12/11/2015 Document Reviewed: 11/09/2015 Elsevier Interactive Patient Education  2017 Elsevier Inc.   

## 2018-04-20 NOTE — Progress Notes (Signed)
Subjective:    Patient ID: Evelyn GarrisonPamela R Ficken, female    DOB: 12/16/1960, 57 y.o.   MRN: [id number]  No chief complaint on file.   HPI Patient was seen today for f/u on chronic issues and TOC, previously seen by Dr. Tawanna Coolerodd.  Pt doing well overall.  Endorses h/o migraines.  May have a migraine a few x/wk or more frequently.  Unsure of triggers.  May wake up with HA.  Taking Maxalt prn and topiramate 100 mg in am and 125 in pm.  Also taking tramadol prn for HAs.  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  Past Medical History:  Diagnosis Date  . Anxiety   . Headache(784.0)     No Known Allergies  ROS General: Denies fever, chills, night sweats, changes in weight, changes in appetite HEENT: Denies ear pain, changes in vision, rhinorrhea, sore throat  +h/o migraines CV: Denies CP, palpitations, SOB, orthopnea Pulm: Denies SOB, cough, wheezing GI: Denies abdominal pain, nausea, vomiting, diarrhea, constipation GU: Denies dysuria, hematuria, frequency, vaginal discharge Msk: Denies muscle cramps, joint pains Neuro: Denies weakness, numbness, tingling Skin: Denies rashes, bruising Psych: Denies depression, anxiety, hallucinations    Objective:    Blood pressure 108/78, pulse 62, temperature 98.2 F (36.8 C), temperature source Oral, weight 167 lb (75.8 kg), SpO2 98 %.  Gen. Pleasant, well-nourished, in no distress, normal affect   HEENT: [place]/AT, face symmetric, no scleral icterus, PERRLA, nares patent without drainage Lungs: no accessory muscle use, CTAB, no wheezes or rales Cardiovascular: RRR, no m/r/g, no peripheral edema Neuro:  A&Ox3, CN II-XII intact, normal gait Skin:  Warm, no lesions/ rash  Wt Readings from Last 3 Encounters:  04/20/18 167 lb (75.8 kg)  10/04/17 168 lb (76.2 kg)  07/04/17 165 lb (74.8 kg)    Lab Results  Component Value Date   WBC 4.1 07/04/2017   HGB 13.1  07/04/2017   HCT 40.0 07/04/2017   PLT 241.0 07/04/2017   GLUCOSE 96 07/04/2017   CHOL 174 10/01/2015   TRIG 80.0 10/01/2015   HDL 50.90 10/01/2015   LDLCALC 107 (H) 10/01/2015   ALT 12 07/04/2017   AST 13 07/04/2017   NA 142 07/04/2017   K 3.9 07/04/2017   CL 108 07/04/2017   CREATININE 0.95 07/04/2017   BUN 14 07/04/2017   CO2 26 07/04/2017   TSH 0.43 07/04/2017    Assessment/Plan:  History of migraine  -discussed HA prevention -given handout -maxalt and Tramadol refilled  F/u prn in next few months for CPE  Abbe AmsterdamShannon Carinne Brandenburger, MD

## 2018-05-28 ENCOUNTER — Other Ambulatory Visit: Payer: Self-pay | Admitting: Family Medicine

## 2018-05-28 NOTE — Telephone Encounter (Signed)
Refill was done on 04/20/2018 for 50 tablets, pt requests for refills. Please advice

## 2018-07-10 ENCOUNTER — Encounter: Payer: Self-pay | Admitting: Family Medicine

## 2018-07-10 ENCOUNTER — Other Ambulatory Visit: Payer: Self-pay | Admitting: Family Medicine

## 2018-07-10 ENCOUNTER — Ambulatory Visit: Payer: BC Managed Care – PPO | Admitting: Family Medicine

## 2018-07-10 VITALS — BP 144/80 | HR 82 | Temp 99.0°F | Wt 164.4 lb

## 2018-07-10 DIAGNOSIS — J019 Acute sinusitis, unspecified: Secondary | ICD-10-CM

## 2018-07-10 MED ORDER — AMOXICILLIN-POT CLAVULANATE 875-125 MG PO TABS: 1.0000 | ORAL_TABLET | Freq: Two times a day (BID) | ORAL | 0 refills | Status: DC

## 2018-07-10 NOTE — Progress Notes (Signed)
   Subjective:    Patient ID: Kandy GarrisonPamela R Lovern, female    DOB: 06/12/1960, 58 y.o.   MRN: [id number]  HPI Here to follow up an urgent care visit on 07-07-18 for symptoms including stuffy head, PND, ear pain, ST, and coughing up green sputum. She has had a fever off and on. Some diarrhea but no nausea or vomiting. This started about 5 days and is getting worse. At the urgent care she tested negative for influenza. It was felt she had a virus, and she was given Phenergan DM for the cough.    Review of Systems  Constitutional: Positive for fever.  HENT: Positive for congestion, ear pain, postnasal drip, sinus pressure, sinus pain and sore throat.   Eyes: Negative.   Respiratory: Positive for cough.   Gastrointestinal: Positive for diarrhea. Negative for abdominal pain, constipation, nausea and vomiting.       Objective:   Physical Exam Constitutional:      Appearance: Normal appearance.  HENT:     Right Ear: Tympanic membrane and ear canal normal.     Left Ear: Tympanic membrane and ear canal normal.     Nose: Nose normal.     Mouth/Throat:     Pharynx: Oropharynx is clear.  Eyes:     Conjunctiva/sclera: Conjunctivae normal.  Pulmonary:     Effort: Pulmonary effort is normal.     Breath sounds: Normal breath sounds.  Lymphadenopathy:     Cervical: No cervical adenopathy.  Neurological:     Mental Status: She is alert.           Assessment & Plan:  Sinusitis, treat with Augmentin. Add Mucinex prn. Gershon CraneStephen Fry, MD

## 2018-07-12 NOTE — Telephone Encounter (Signed)
LOV was on 07/10/2018 seen by Dr Clent Ridges and last refill was done on 05/28/2018 for 50 tablets, please advise

## 2018-08-20 ENCOUNTER — Other Ambulatory Visit: Payer: Self-pay | Admitting: Family Medicine

## 2018-08-20 NOTE — Telephone Encounter (Signed)
Copied from CRM [id number]. Topic: Quick Communication - Rx Refill/Question >> Aug 20, 2018  2:57 PM Wyonia Hough E wrote: Medication: topiramate (TOPAMAX) 100 MG tablet - Pt is out of medication she notified and put in a request at the pharmacy last week   Has the patient contacted their pharmacy? Yes - request sent to office   Preferred Pharmacy (with phone number or street name): [facility] [phone] - [place], [place] - [address] [phone] (Phone) [phone] (Fax)    Agent: Please be advised that RX refills may take up to 3 business days. We ask that you follow-up with your pharmacy.

## 2018-08-21 NOTE — Telephone Encounter (Signed)
Requested medication (s) are due for refill today: yes  Requested medication (s) are on the active medication list: yes  Last refill:  07/04/17  Future visit scheduled: no  Notes to clinic:  Not delegated    Requested Prescriptions  Pending Prescriptions Disp Refills   topiramate (TOPAMAX) 100 MG tablet 200 tablet 4    Sig: Take 1 tablet (100 mg total) by mouth 2 (two) times daily.     Not Delegated - Neurology: Anticonvulsants - topiramate & zonisamide Failed - 08/20/2018  7:31 PM      Failed - This refill cannot be delegated      Failed - Cr in normal range and within 360 days    Creatinine, Ser  Date Value Ref Range Status  07/04/2017 0.95 0.40 - 1.20 mg/dL Final         Failed - CO2 in normal range and within 360 days    CO2  Date Value Ref Range Status  07/04/2017 26 19 - 32 mEq/L Final         Passed - Valid encounter within last 12 months    Recent Outpatient Visits          1 month ago Acute sinusitis, recurrence not specified, unspecified location   [facility], Tera Mater, MD   4 months ago History of migraine   [facility], Bettey Mare, MD   10 months ago Viral URI with cough   [facility], Bettey Mare, MD   1 year ago Chronic cluster headache, not intractable   [facility], Eugenio Hoes, MD   2 years ago Cough   [facility] -[place], [place], [place]

## 2018-08-22 ENCOUNTER — Other Ambulatory Visit: Payer: Self-pay | Admitting: Family Medicine

## 2018-08-22 NOTE — Telephone Encounter (Signed)
Pt was last seen on 07/10/2018 by Dr Clent Ridges and last refill was done on 07/04/2017 for 200 tablets with 4 refills by dr Tawanna Cooler, ok to send refills

## 2018-09-14 ENCOUNTER — Other Ambulatory Visit: Payer: Self-pay | Admitting: Family Medicine

## 2018-09-14 DIAGNOSIS — Z8669 Personal history of other diseases of the nervous system and sense organs: Secondary | ICD-10-CM

## 2018-09-17 NOTE — Telephone Encounter (Signed)
Please advise if ok to refill. 

## 2018-09-24 ENCOUNTER — Other Ambulatory Visit: Payer: Self-pay | Admitting: Family Medicine

## 2018-09-25 NOTE — Telephone Encounter (Signed)
Pt LOV was 07/10/2018 and last refill was 07/13/2018 for 60 tablets with 1 refill, please advise

## 2018-10-30 ENCOUNTER — Other Ambulatory Visit: Payer: Self-pay | Admitting: Family Medicine

## 2018-10-30 DIAGNOSIS — Z1231 Encounter for screening mammogram for malignant neoplasm of breast: Secondary | ICD-10-CM

## 2018-11-01 ENCOUNTER — Other Ambulatory Visit: Payer: Self-pay | Admitting: Family Medicine

## 2018-11-02 NOTE — Telephone Encounter (Signed)
Pt LOV was on 07/10/2018 and last refill was done on 09/25/2018 for 60 tabs, please advise

## 2018-11-19 ENCOUNTER — Other Ambulatory Visit: Payer: Self-pay

## 2018-11-19 ENCOUNTER — Ambulatory Visit (INDEPENDENT_AMBULATORY_CARE_PROVIDER_SITE_OTHER): Payer: BC Managed Care – PPO | Admitting: Family Medicine

## 2018-11-19 ENCOUNTER — Encounter: Payer: Self-pay | Admitting: Family Medicine

## 2018-11-19 DIAGNOSIS — R42 Dizziness and giddiness: Secondary | ICD-10-CM

## 2018-11-19 DIAGNOSIS — W57XXXA Bitten or stung by nonvenomous insect and other nonvenomous arthropods, initial encounter: Secondary | ICD-10-CM | POA: Diagnosis not present

## 2018-11-19 DIAGNOSIS — S80869A Insect bite (nonvenomous), unspecified lower leg, initial encounter: Secondary | ICD-10-CM

## 2018-11-19 MED ORDER — DOXYCYCLINE HYCLATE 100 MG PO TABS: 200.0000 mg | ORAL_TABLET | Freq: Once | ORAL | 0 refills | Status: AC

## 2018-11-19 NOTE — Progress Notes (Signed)
Virtual Visit via Video Note  I connected with Evelyn Edwards on 11/19/18 at 10:00 AM EDT by a video enabled telemedicine application and verified that I am speaking with the correct person using two identifiers.  Location patient: home Location provider:work or home office Persons participating in the virtual visit: patient, provider  I discussed the limitations of evaluation and management by telemedicine and the availability of in person appointments. The patient expressed understanding and agreed to proceed.   HPI: Pt felt lightheaded on Friday.  On Saturday, she felt like she was going to pass out after being outside decorating her car for her relatives graduation parade.  Pt states she has not been drinking that much water.  Later that day she noticed a tick on her leg. She removed it, but was unsure if the tick bit her.  Pt states there is a slightly dark mark on her leg where the tick was, but no rash.  Pt denies fever, chills, joint pain, rash, erythema or increased warmth at site of tick bite.  ROS: See pertinent positives and negatives per HPI.  Past Medical History:  Diagnosis Date  . Anxiety   . ZDGLOVFI(433.2)     Past Surgical History:  Procedure Laterality Date  . ABDOMINAL HYSTERECTOMY     ovaries were left intact    Family History  Problem Relation Age of Onset  . Hypertension Father   . Diabetes Father   . Coronary artery disease Father   . Colon polyps Father   . Asthma Mother   . Lupus Sister   . 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  Pt will not be working those jobs over the summer.  Pt has a fiance.   Current Outpatient Medications:  .  amoxicillin-clavulanate (AUGMENTIN) 875-125 MG tablet, Take 1 tablet by mouth 2 (two) times daily., Disp: 20 tablet, Rfl: 0 .  rizatriptan (MAXALT-MLT) 5 MG disintegrating tablet, TAKE 1 TABLET BY  MOUTH IF NEEDED FOR MIGRAINES MAY REPEAT IN 2 HOURS IF NEEDED, Disp: 10 tablet, Rfl: 6 .  topiramate (TOPAMAX) 100 MG tablet, TAKE 1 TABLET BY MOUTH TWICE A DAY, Disp: 180 tablet, Rfl: 3 .  topiramate (TOPAMAX) 25 MG tablet, TAKE 1 TABLET BY MOUTH AT BEDTIME, Disp: 90 tablet, Rfl: 3 .  traMADol (ULTRAM) 50 MG tablet, TAKE 1 TABLET(50 MG) BY MOUTH EVERY 8 HOURS AS NEEDED, Disp: 60 tablet, Rfl: 1 .  triamcinolone cream (KENALOG) 0.1 %, Apply 1 application topically 2 (two) times daily. (Patient taking differently: Apply 1 application topically as needed. ), Disp: 30 g, Rfl: 2  EXAM:  VITALS per patient if applicable:  RR between 12-20 bpm  GENERAL: alert, oriented, appears well and in no acute distress  HEENT: atraumatic, conjunctiva clear, no obvious abnormalities on inspection of external nose and ears  NECK: normal movements of the head and neck  LUNGS: on inspection no signs of respiratory distress, breathing rate appears normal, no obvious gross SOB, gasping or wheezing  CV: no obvious cyanosis  MS: moves all visible extremities without noticeable abnormality  PSYCH/NEURO: pleasant and cooperative, no obvious depression or anxiety, speech and thought processing grossly intact  ASSESSMENT AND PLAN:  Discussed the following assessment and plan:  Tick bite, initial encounter  -will start ppx dose of doxycycline -given precautions - Plan: doxycycline (VIBRA-TABS) 100 MG tablet  Lightheadedness -discussed  s/s of dehydration -pt encouraged to increase po intake of water and flulids. -pt also encouraged to eat regular meals  F/u prn   I discussed the assessment and treatment plan with the patient. The patient was provided an opportunity to ask questions and all were answered. The patient agreed with the plan and demonstrated an understanding of the instructions.   The patient was advised to call back or seek an in-person evaluation if the symptoms worsen or if the condition  fails to improve as anticipated.   Deeann SaintShannon R Clemons Salvucci, MD

## 2018-12-14 ENCOUNTER — Other Ambulatory Visit: Payer: Self-pay

## 2018-12-14 ENCOUNTER — Ambulatory Visit
Admission: RE | Admit: 2018-12-14 | Discharge: 2018-12-14 | Disposition: A | Discharge status: Home / Self Care | Payer: BC Managed Care – PPO | Source: Ambulatory Visit | Attending: Family Medicine | Admitting: Family Medicine

## 2018-12-14 DIAGNOSIS — Z1231 Encounter for screening mammogram for malignant neoplasm of breast: Secondary | ICD-10-CM

## 2019-01-06 ENCOUNTER — Other Ambulatory Visit: Payer: Self-pay | Admitting: Family Medicine

## 2019-01-08 NOTE — Telephone Encounter (Signed)
Please advise 

## 2019-02-10 ENCOUNTER — Other Ambulatory Visit: Payer: Self-pay | Admitting: Family Medicine

## 2019-02-10 DIAGNOSIS — Z8669 Personal history of other diseases of the nervous system and sense organs: Secondary | ICD-10-CM

## 2019-04-01 ENCOUNTER — Telehealth (INDEPENDENT_AMBULATORY_CARE_PROVIDER_SITE_OTHER): Payer: BC Managed Care – PPO | Admitting: Family Medicine

## 2019-04-01 DIAGNOSIS — M25561 Pain in right knee: Secondary | ICD-10-CM

## 2019-04-01 DIAGNOSIS — Z8669 Personal history of other diseases of the nervous system and sense organs: Secondary | ICD-10-CM

## 2019-04-01 MED ORDER — TRAMADOL HCL 50 MG PO TABS: ORAL_TABLET | ORAL | 3 refills | Status: DC

## 2019-04-01 NOTE — Progress Notes (Signed)
Virtual Visit via Video Note  I connected with Evelyn Edwards on 04/01/19 at  4:30 PM EDT by a video enabled telemedicine application 2/2 COVID-19 pandemic and verified that I am speaking with the correct person using two identifiers.  Location patient: home Location provider:work or home office Persons participating in the virtual visit: patient, provider  I discussed the limitations of evaluation and management by telemedicine and the availability of in person appointments. The patient expressed understanding and agreed to proceed.   HPI: Pt with R knee pain x several days.  Cannot recall injury, hearing any pops, clicks, or feeling any tears.  S/p arthroscopic surgery several yrs ago.  Feels tight in the back of her knee when lifting leg.  Also pain in front of knee with walking.  Denies edema, erythema, or deformity.  Pt having 2 migraines per week.  Taking maxalt, tramadol, and topiramate.  Drinking 2 cups coffee per day.   Does not endorse increased stress, though states her daughter's husband at times causes increased stress.  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   ROS: See pertinent positives and negatives per HPI.  Past Medical History:  Diagnosis Date  . Anxiety   . DTOIZTIW(580.9)     Past Surgical History:  Procedure Laterality Date  . ABDOMINAL HYSTERECTOMY     ovaries were left intact    Family History  Problem Relation Age of Onset  . Hypertension Father   . Diabetes Father   . Coronary artery disease Father   . Colon polyps Father   . Asthma Mother   . Lupus Sister   . Anxiety disorder Other       Current Outpatient Medications:  .  amoxicillin-clavulanate (AUGMENTIN) 875-125 MG tablet, Take 1 tablet by mouth 2 (two) times daily., Disp: 20 tablet, Rfl: 0 .  rizatriptan (MAXALT-MLT) 5 MG disintegrating tablet, TAKE 1 TABLET BY MOUTH IF NEEDED FOR MIGRAINES. MAY REPEAT IN 2 HOURS IF NEEDED, Disp: 10 tablet, Rfl: 6 .  topiramate  (TOPAMAX) 100 MG tablet, TAKE 1 TABLET BY MOUTH TWICE A DAY, Disp: 180 tablet, Rfl: 3 .  topiramate (TOPAMAX) 25 MG tablet, TAKE 1 TABLET BY MOUTH AT BEDTIME, Disp: 90 tablet, Rfl: 3 .  traMADol (ULTRAM) 50 MG tablet, TAKE 1 TABLET(50 MG) BY MOUTH EVERY 8 HOURS AS NEEDED, Disp: 60 tablet, Rfl: 2 .  triamcinolone cream (KENALOG) 0.1 %, Apply 1 application topically 2 (two) times daily. (Patient taking differently: Apply 1 application topically as needed. ), Disp: 30 g, Rfl: 2  EXAM:  VITALS per patient if applicable: RR between 12-20 bpm  GENERAL: alert, oriented, appears well and in no acute distress  HEENT: atraumatic, conjunctiva clear, no obvious abnormalities on inspection of external nose and ears  NECK: normal movements of the head and neck  LUNGS: on inspection no signs of respiratory distress, breathing rate appears normal, no obvious gross SOB, gasping or wheezing  CV: no obvious cyanosis  MS: moves all visible extremities without noticeable abnormality.  No TTP of joint line of R knee when pt palpates.  PSYCH/NEURO: pleasant and cooperative, no obvious depression or anxiety, speech and thought processing grossly intact  ASSESSMENT AND PLAN:  Discussed the following assessment and plan:  Acute pain of right knee  -pain possibly 2/2 arthritis, effusion -discussed supportive care: rest, ice/heat, Tylenol arthritis strength -discussed OTC Voltaren gel prn -given h/o arthroscopic surgery will obtain imaging.  Consider Ortho referral. - Plan: DG Knee  Complete 4 Views Right  History of migraine -discussed migraine prevention -continue current meds.  F/u prn for continued symptoms  I discussed the assessment and treatment plan with the patient. The patient was provided an opportunity to ask questions and all were answered. The patient agreed with the plan and demonstrated an understanding of the instructions.   The patient was advised to call back or seek an in-person  evaluation if the symptoms worsen or if the condition fails to improve as anticipated.   Billie Ruddy, MD

## 2019-04-09 ENCOUNTER — Other Ambulatory Visit: Payer: Self-pay

## 2019-04-09 ENCOUNTER — Other Ambulatory Visit (INDEPENDENT_AMBULATORY_CARE_PROVIDER_SITE_OTHER): Payer: BC Managed Care – PPO

## 2019-04-09 ENCOUNTER — Ambulatory Visit (INDEPENDENT_AMBULATORY_CARE_PROVIDER_SITE_OTHER): Payer: BC Managed Care – PPO

## 2019-04-09 DIAGNOSIS — M25561 Pain in right knee: Secondary | ICD-10-CM | POA: Diagnosis not present

## 2019-04-09 DIAGNOSIS — Z23 Encounter for immunization: Secondary | ICD-10-CM | POA: Diagnosis not present

## 2019-07-03 ENCOUNTER — Other Ambulatory Visit: Payer: Self-pay | Admitting: Family Medicine

## 2019-07-03 DIAGNOSIS — Z8669 Personal history of other diseases of the nervous system and sense organs: Secondary | ICD-10-CM

## 2019-07-04 NOTE — Telephone Encounter (Signed)
Pt LOV was 04/01/2019 and last refill was done on 02/12/2019 for 10 with 6 refills. Ok to sent refill

## 2019-07-23 ENCOUNTER — Other Ambulatory Visit: Payer: Self-pay

## 2019-07-24 ENCOUNTER — Ambulatory Visit (INDEPENDENT_AMBULATORY_CARE_PROVIDER_SITE_OTHER): Payer: BC Managed Care – PPO | Admitting: Family Medicine

## 2019-07-24 ENCOUNTER — Encounter: Payer: Self-pay | Admitting: Family Medicine

## 2019-07-24 VITALS — BP 120/76 | HR 70 | Temp 97.5°F | Wt 183.0 lb

## 2019-07-24 DIAGNOSIS — H66002 Acute suppurative otitis media without spontaneous rupture of ear drum, left ear: Secondary | ICD-10-CM | POA: Diagnosis not present

## 2019-07-24 MED ORDER — AMOXICILLIN 500 MG PO TABS: 500.0000 mg | ORAL_TABLET | Freq: Two times a day (BID) | ORAL | 0 refills | Status: AC

## 2019-07-24 NOTE — Progress Notes (Signed)
Subjective:    Patient ID: Evelyn Edwards, female    DOB: 1961-05-19, 59 y.o.   MRN: [id number]  No chief complaint on file.   HPI Patient was seen today for acute concern.  Pt endorses L ear pain x 3 days.  Pain is sharp and causing a HA.  Pt denies fever, chills, rhinorrhea, cough, sore throat.  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  Pt notes her father was recently dx'd with a tumor in his head.  Pt is helping to take care of him.  Overall he is doing well.  Past Medical History:  Diagnosis Date  . Anxiety   . Headache(784.0)     No Known Allergies  ROS General: Denies fever, chills, night sweats, changes in weight, changes in appetite HEENT: Denies headaches, changes in vision, rhinorrhea, sore throat  + L ear pain CV: Denies CP, palpitations, SOB, orthopnea Pulm: Denies SOB, cough, wheezing GI: Denies abdominal pain, nausea, vomiting, diarrhea, constipation GU: Denies dysuria, hematuria, frequency, vaginal discharge Msk: Denies muscle cramps, joint pains Neuro: Denies weakness, numbness, tingling Skin: Denies rashes, bruising Psych: Denies depression, anxiety, hallucinations    Objective:    Blood pressure 120/76, pulse 70, temperature (!) 97.5 F (36.4 C), temperature source Temporal, weight 183 lb (83 kg), SpO2 98 %.   Gen. Pleasant, well-nourished, in no distress, normal affect  HEENT: [place]/AT, face symmetric, no scleral icterus, PERRLA, EOMI, nares patent without drainage, pharynx without erythema or exudate.  R TM normal.  L TM full with small amount of purulence noted in R upper quadrant. Lungs: no accessory muscle use, CTAB, no wheezes or rales Cardiovascular: RRR, no m/r/g, no peripheral edema Neuro:  A&Ox3, CN II-XII intact, normal gait Skin:  Warm, no lesions/ rash   Wt Readings from Last 3 Encounters:  07/24/19 183 lb (83 kg)  07/10/18 164 lb 6 oz (74.6 kg)  04/20/18 167 lb (75.8 kg)    Lab Results  Component Value Date   WBC 4.1  07/04/2017   HGB 13.1 07/04/2017   HCT 40.0 07/04/2017   PLT 241.0 07/04/2017   GLUCOSE 96 07/04/2017   CHOL 174 10/01/2015   TRIG 80.0 10/01/2015   HDL 50.90 10/01/2015   LDLCALC 107 (H) 10/01/2015   ALT 12 07/04/2017   AST 13 07/04/2017   NA 142 07/04/2017   K 3.9 07/04/2017   CL 108 07/04/2017   CREATININE 0.95 07/04/2017   BUN 14 07/04/2017   CO2 26 07/04/2017   TSH 0.43 07/04/2017    Assessment/Plan:  Non-recurrent acute suppurative otitis media of left ear without spontaneous rupture of tympanic membrane  -supportive care -given handout - Plan: amoxicillin (AMOXIL) 500 MG tablet  F/u prn for continued or worsened symptoms  Abbe Amsterdam, MD

## 2019-07-24 NOTE — Patient Instructions (Signed)

## 2019-08-07 ENCOUNTER — Other Ambulatory Visit: Payer: Self-pay

## 2019-08-07 ENCOUNTER — Encounter: Payer: Self-pay | Admitting: Family Medicine

## 2019-08-07 ENCOUNTER — Ambulatory Visit (INDEPENDENT_AMBULATORY_CARE_PROVIDER_SITE_OTHER): Payer: BC Managed Care – PPO | Admitting: Family Medicine

## 2019-08-07 VITALS — BP 126/80 | HR 64 | Temp 97.5°F | Ht 64.0 in | Wt 186.1 lb

## 2019-08-07 DIAGNOSIS — S39012A Strain of muscle, fascia and tendon of lower back, initial encounter: Secondary | ICD-10-CM | POA: Diagnosis not present

## 2019-08-07 MED ORDER — METHOCARBAMOL 500 MG PO TABS: 500.0000 mg | ORAL_TABLET | Freq: Four times a day (QID) | ORAL | 0 refills | Status: DC | PRN

## 2019-08-07 NOTE — Progress Notes (Signed)
  Subjective:     Patient ID: Evelyn Edwards, female   DOB: March 05, 1961, 59 y.o.   MRN: [id number]  HPI   Evelyn Edwards is seen with right lower lumbar back pain.  She states this morning around 9 AM she was getting up off the sofa to go visit her parents and when she did she felt a pulling sensation right lower lumbar area.  This has gotten worse through the day.  She has some increased stiffness.  No radiculitis symptoms.  She has pain with twisting or movement.  She took 600 mg ibuprofen with minimal relief.  She has tried some heat.  No prior history of chronic back difficulties.    Past Medical History:  Diagnosis Date  . Anxiety   . ELFYBOFB(510.2)    Past Surgical History:  Procedure Laterality Date  . ABDOMINAL HYSTERECTOMY     ovaries were left intact    reports that she has never smoked. She has never used smokeless tobacco. She reports that she does not drink alcohol or use drugs. family history includes Anxiety disorder in an other family member; Asthma in her mother; Colon polyps in her father; Coronary artery disease in her father; Diabetes in her father; Hypertension in her father; Lupus in her sister. No Known Allergies   Review of Systems  Constitutional: Negative for chills and fever.  Genitourinary: Negative for dysuria.  Musculoskeletal: Positive for back pain.  Neurological: Negative for weakness and numbness.       Objective:   Physical Exam Vitals reviewed.  Constitutional:      Appearance: Normal appearance.  Cardiovascular:     Rate and Rhythm: Normal rate and regular rhythm.  Pulmonary:     Effort: Pulmonary effort is normal.     Breath sounds: Normal breath sounds.  Musculoskeletal:     Comments: Straight leg raise are negative bilaterally.  No spinal tenderness.  She has some mild right lower lumbar muscular tenderness.  Neurological:     Mental Status: She is alert.     Comments: Full strength lower extremities.  Symmetric reflexes knee and ankle  bilaterally.        Assessment:     Right lower lumbar back strain.  Nonfocal neuro exam.  Suspect this is all muscular    Plan:     -Recommend moist heat and consider muscle massage with over-the-counter sports cream -Robaxin 500 mg nightly as needed muscle spasm -Continue ibuprofen as needed -Gentle stretches as tolerated.  Be in touch if this is not continuing to improve over the next couple of weeks  Evelyn Covey MD [facility]

## 2019-08-07 NOTE — Patient Instructions (Signed)
Lumbar Strain A lumbar strain, which is sometimes called a low-back strain, is a stretch or tear in a muscle or the strong cords of tissue that attach muscle to bone (tendons) in the lower back (lumbar spine). This type of injury occurs when muscles or tendons are torn or are stretched beyond their limits. Lumbar strains can range from mild to severe. Mild strains may involve stretching a muscle or tendon without tearing it. These may heal in 1-2 weeks. More severe strains involve tearing of muscle fibers or tendons. These will cause more pain and may take 6-8 weeks to heal. What are the causes? This condition may be caused by:  Trauma, such as a fall or a hit to the body.  Twisting or overstretching the back. This may result from doing activities that need a lot of energy, such as lifting heavy objects. What increases the risk? This injury is more common in:  Athletes.  People with obesity.  People who do repeated lifting, bending, or other movements that involve their back. What are the signs or symptoms? Symptoms of this condition may include:  Sharp or dull pain in the lower back that does not go away. The pain may extend to the buttocks.  Stiffness or limited range of motion.  Sudden muscle tightening (spasms). How is this diagnosed? This condition may be diagnosed based on:  Your symptoms.  Your medical history.  A physical exam.  Imaging tests, such as: ? X-rays. ? MRI. How is this treated? Treatment for this condition may include:  Rest.  Applying heat and cold to the affected area.  Over-the-counter medicines to help relieve pain and inflammation, such as NSAIDs.  Prescription pain medicine and muscle relaxants may be needed for a short time.  Physical therapy. Follow these instructions at home: Managing pain, stiffness, and swelling      If directed, put ice on the injured area during the first 24 hours after your injury. ? Put ice in a plastic  bag. ? Place a towel between your skin and the bag. ? Leave the ice on for 20 minutes, 2-3 times a day.  If directed, apply heat to the affected area as often as told by your health care provider. Use the heat source that your health care provider recommends, such as a moist heat pack or a heating pad. ? Place a towel between your skin and the heat source. ? Leave the heat on for 20-30 minutes. ? Remove the heat if your skin turns bright red. This is especially important if you are unable to feel pain, heat, or cold. You may have a greater risk of getting burned. Activity  Rest and return to your normal activities as told by your health care provider. Ask your health care provider what activities are safe for you.  Do exercises as told by your health care provider. Medicines  Take over-the-counter and prescription medicines only as told by your health care provider.  Ask your health care provider if the medicine prescribed to you: ? Requires you to avoid driving or using heavy machinery. ? Can cause constipation. You may need to take these actions to prevent or treat constipation:  Drink enough fluid to keep your urine pale yellow.  Take over-the-counter or prescription medicines.  Eat foods that are high in fiber, such as beans, whole grains, and fresh fruits and vegetables.  Limit foods that are high in fat and processed sugars, such as fried or sweet foods. Injury prevention To prevent   a future low-back injury:  Always warm up properly before physical activity or sports.  Cool down and stretch after being active.  Use correct form when playing sports and lifting heavy objects. Bend your knees before you lift heavy objects.  Use good posture when sitting and standing.  Stay physically fit and keep a healthy weight. ? Do at least 150 minutes of moderate-intensity exercise each week, such as brisk walking or water aerobics. ? Do strength exercises at least 2 times each  week.  General instructions  Do not use any products that contain nicotine or tobacco, such as cigarettes, e-cigarettes, and chewing tobacco. If you need help quitting, ask your health care provider.  Keep all follow-up visits as told by your health care provider. This is important. Contact a health care provider if:  Your back pain does not improve after 6 weeks of treatment.  Your symptoms get worse. Get help right away if:  Your back pain is severe.  You are unable to stand or walk.  You develop pain in your legs.  You develop weakness in your buttocks or legs.  You have difficulty controlling when you urinate or when you have a bowel movement. ? You have frequent, painful, or bloody urination. ? You have a temperature over 101.0F (38.3C) Summary  A lumbar strain, which is sometimes called a low-back strain, is a stretch or tear in a muscle or the strong cords of tissue that attach muscle to bone (tendons) in the lower back (lumbar spine).  This type of injury occurs when muscles or tendons are torn or are stretched beyond their limits.  Rest and return to your normal activities as told by your health care provider. If directed, apply heat and ice to the affected area as often as told by your health care provider.  Take over-the-counter and prescription medicines only as told by your health care provider.  Contact a health care provider if you have new or worsening symptoms. This information is not intended to replace advice given to you by your health care provider. Make sure you discuss any questions you have with your health care provider. Document Revised: 03/22/2018 Document Reviewed: 03/22/2018 Elsevier Patient Education  2020 Elsevier Inc.  

## 2019-08-12 ENCOUNTER — Other Ambulatory Visit: Payer: Self-pay | Admitting: Family Medicine

## 2019-08-12 ENCOUNTER — Telehealth: Payer: Self-pay

## 2019-08-12 NOTE — Telephone Encounter (Signed)
Pt has appointment schedule for 09/04/2019, LOV was on 07/24/2019 and last refill was done on 08/22/2019 for 180 tablets with 3 refills please advise if ok to send refill

## 2019-08-12 NOTE — Telephone Encounter (Signed)
Pt has appointment scheduled for 09/04/2019, LOV was 08/07/2019 and last refill was done on 08/22/2018 for 180 tablets with 3 refills, Please advise

## 2019-09-03 ENCOUNTER — Other Ambulatory Visit: Payer: Self-pay

## 2019-09-04 ENCOUNTER — Ambulatory Visit (INDEPENDENT_AMBULATORY_CARE_PROVIDER_SITE_OTHER): Payer: BC Managed Care – PPO | Admitting: Family Medicine

## 2019-09-04 ENCOUNTER — Encounter: Payer: Self-pay | Admitting: Family Medicine

## 2019-09-04 VITALS — BP 102/60 | HR 63 | Temp 96.4°F | Ht 64.0 in | Wt 182.8 lb

## 2019-09-04 DIAGNOSIS — M79605 Pain in left leg: Secondary | ICD-10-CM | POA: Diagnosis not present

## 2019-09-04 DIAGNOSIS — Z23 Encounter for immunization: Secondary | ICD-10-CM

## 2019-09-04 DIAGNOSIS — Z0000 Encounter for general adult medical examination without abnormal findings: Secondary | ICD-10-CM

## 2019-09-04 DIAGNOSIS — Z13220 Encounter for screening for lipoid disorders: Secondary | ICD-10-CM | POA: Diagnosis not present

## 2019-09-04 DIAGNOSIS — Z1159 Encounter for screening for other viral diseases: Secondary | ICD-10-CM

## 2019-09-04 LAB — CBC WITH DIFFERENTIAL/PLATELET
Basophils Absolute: 0 10*3/uL (ref 0.0–0.1)
Basophils Relative: 0.6 % (ref 0.0–3.0)
Eosinophils Absolute: 0.1 10*3/uL (ref 0.0–0.7)
Eosinophils Relative: 2.1 % (ref 0.0–5.0)
HCT: 38.9 % (ref 36.0–46.0)
Hemoglobin: 12.9 g/dL (ref 12.0–15.0)
Lymphocytes Relative: 41.9 % (ref 12.0–46.0)
Lymphs Abs: 1.8 10*3/uL (ref 0.7–4.0)
MCHC: 33.1 g/dL (ref 30.0–36.0)
MCV: 92 fl (ref 78.0–100.0)
Monocytes Absolute: 0.4 10*3/uL (ref 0.1–1.0)
Monocytes Relative: 9.1 % (ref 3.0–12.0)
Neutro Abs: 2 10*3/uL (ref 1.4–7.7)
Neutrophils Relative %: 46.3 % (ref 43.0–77.0)
Platelets: 219 10*3/uL (ref 150.0–400.0)
RBC: 4.22 Mil/uL (ref 3.87–5.11)
RDW: 13.2 % (ref 11.5–15.5)
WBC: 4.3 10*3/uL (ref 4.0–10.5)

## 2019-09-04 LAB — BASIC METABOLIC PANEL
BUN: 16 mg/dL (ref 6–23)
CO2: 23 mEq/L (ref 19–32)
Calcium: 9.4 mg/dL (ref 8.4–10.5)
Chloride: 109 mEq/L (ref 96–112)
Creatinine, Ser: 0.94 mg/dL (ref 0.40–1.20)
GFR: 73.71 mL/min (ref >60.00)
Glucose, Bld: 89 mg/dL (ref 70–99)
Potassium: 4.3 mEq/L (ref 3.5–5.1)
Sodium: 140 mEq/L (ref 135–145)

## 2019-09-04 LAB — LIPID PANEL
Cholesterol: 168 mg/dL (ref 0–200)
HDL: 47.3 mg/dL (ref >39.00)
LDL Cholesterol: 101 mg/dL — ABNORMAL HIGH (ref 0–99)
NonHDL: 120.91
Total CHOL/HDL Ratio: 4
Triglycerides: 100 mg/dL (ref 0.0–149.0)
VLDL: 20 mg/dL (ref 0.0–40.0)

## 2019-09-04 LAB — HEMOGLOBIN A1C: Hgb A1c MFr Bld: 5.8 % (ref 4.6–6.5)

## 2019-09-04 MED ORDER — TRAMADOL HCL 50 MG PO TABS: ORAL_TABLET | ORAL | 3 refills | Status: DC

## 2019-09-04 NOTE — Patient Instructions (Addendum)
Preventive Care 56-59 Years Old, Female Preventive care refers to visits with your health care provider and lifestyle choices that can promote health and wellness. This includes:  A yearly physical exam. This may also be called an annual well check.  Regular dental visits and eye exams.  Immunizations.  Screening for certain conditions.  Healthy lifestyle choices, such as eating a healthy diet, getting regular exercise, not using drugs or products that contain nicotine and tobacco, and limiting alcohol use. What can I expect for my preventive care visit? Physical exam Your health care provider will check your:  Height and weight. This may be used to calculate body mass index (BMI), which tells if you are at a healthy weight.  Heart rate and blood pressure.  Skin for abnormal spots. Counseling Your health care provider may ask you questions about your:  Alcohol, tobacco, and drug use.  Emotional well-being.  Home and relationship well-being.  Sexual activity.  Eating habits.  Work and work Statistician.  Method of birth control.  Menstrual cycle.  Pregnancy history. What immunizations do I need?  Influenza (flu) vaccine  This is recommended every year. Tetanus, diphtheria, and pertussis (Tdap) vaccine  You may need a Td booster every 10 years. Varicella (chickenpox) vaccine  You may need this if you have not been vaccinated. Zoster (shingles) vaccine  You may need this after age 46. Measles, mumps, and rubella (MMR) vaccine  You may need at least one dose of MMR if you were born in 1957 or later. You may also need a second dose. Pneumococcal conjugate (PCV13) vaccine  You may need this if you have certain conditions and were not previously vaccinated. Pneumococcal polysaccharide (PPSV23) vaccine  You may need one or two doses if you smoke cigarettes or if you have certain conditions. Meningococcal conjugate (MenACWY) vaccine  You may need this if  you have certain conditions. Hepatitis A vaccine  You may need this if you have certain conditions or if you travel or work in places where you may be exposed to hepatitis A. Hepatitis B vaccine  You may need this if you have certain conditions or if you travel or work in places where you may be exposed to hepatitis B. Haemophilus influenzae type b (Hib) vaccine  You may need this if you have certain conditions. Human papillomavirus (HPV) vaccine  If recommended by your health care provider, you may need three doses over 6 months. You may receive vaccines as individual doses or as more than one vaccine together in one shot (combination vaccines). Talk with your health care provider about the risks and benefits of combination vaccines. What tests do I need? Blood tests  Lipid and cholesterol levels. These may be checked every 5 years, or more frequently if you are over 62 years old.  Hepatitis C test.  Hepatitis B test. Screening  Lung cancer screening. You may have this screening every year starting at age 8 if you have a 30-pack-year history of smoking and currently smoke or have quit within the past 15 years.  Colorectal cancer screening. All adults should have this screening starting at age 30 and continuing until age 97. Your health care provider may recommend screening at age 66 if you are at increased risk. You will have tests every 1-10 years, depending on your results and the type of screening test.  Diabetes screening. This is done by checking your blood sugar (glucose) after you have not eaten for a while (fasting). You may have  this done every 1-3 years.  Mammogram. This may be done every 1-2 years. Talk with your health care provider about when you should start having regular mammograms. This may depend on whether you have a family history of breast cancer.  BRCA-related cancer screening. This may be done if you have a family history of breast, ovarian, tubal, or  peritoneal cancers.  Pelvic exam and Pap test. This may be done every 3 years starting at age 76. Starting at age 59, this may be done every 5 years if you have a Pap test in combination with an HPV test. Other tests  Sexually transmitted disease (STD) testing.  Bone density scan. This is done to screen for osteoporosis. You may have this scan if you are at high risk for osteoporosis. Follow these instructions at home: Eating and drinking  Eat a diet that includes fresh fruits and vegetables, whole grains, lean protein, and low-fat dairy.  Take vitamin and mineral supplements as recommended by your health care provider.  Do not drink alcohol if: ? Your health care provider tells you not to drink. ? You are pregnant, may be pregnant, or are planning to become pregnant.  If you drink alcohol: ? Limit how much you have to 0-1 drink a day. ? Be aware of how much alcohol is in your drink. In the [place] one drink equals one 12 oz bottle of beer (355 mL), one 5 oz glass of wine (148 mL), or one 1 oz glass of hard liquor (44 mL). Lifestyle  Take daily care of your teeth and gums.  Stay active. Exercise for at least 30 minutes on 5 or more days each week.  Do not use any products that contain nicotine or tobacco, such as cigarettes, e-cigarettes, and chewing tobacco. If you need help quitting, ask your health care provider.  If you are sexually active, practice safe sex. Use a condom or other form of birth control (contraception) in order to prevent pregnancy and STIs (sexually transmitted infections).  If told by your health care provider, take low-dose aspirin daily starting at age 48. What's next?  Visit your health care provider once a year for a well check visit.  Ask your health care provider how often you should have your eyes and teeth checked.  Stay up to date on all vaccines. This information is not intended to replace advice given to you by your health care provider. Make  sure you discuss any questions you have with your health care provider. Document Revised: 02/01/2018 Document Reviewed: 02/01/2018 Elsevier Patient Education  [place] [facility] Ask your health care provider which exercises are safe for you. Do exercises exactly as told by your health care provider and adjust them as directed. It is normal to feel mild stretching, pulling, tightness, or discomfort as you do these exercises. Stop right away if you feel sudden pain or your pain gets significantly worse. Do not begin these exercises until told by your health care provider. Stretching and range-of-motion exercises These exercises warm up your muscles and joints and improve the movement and flexibility of your hip and pelvis. Quadriceps stretch, prone  1. Lie on your abdomen on a firm surface, such as a bed or padded floor (prone position). 2. Bend your left / right knee and reach back to hold your ankle or pant leg. If you cannot reach your ankle or pant leg, loop a belt around your foot and grab the belt instead. 3. Gently  pull your heel toward your buttocks. Your knee should not slide out to the side. You should feel a stretch in the front of your thigh and knee (quadriceps). 4. Hold this position for __________ seconds. Repeat __________ times. Complete this exercise __________ times a day. Iliotibial band stretch An iliotibial band is a strong band of muscle tissue that runs from the outer side of your hip to the outer side of your thigh and knee. 1. Lie on your side with your left / right leg in the top position. 2. Bend both of your knees and grab your left / right ankle. Stretch out your bottom arm to help you balance. 3. Slowly bring your top knee back so your thigh goes behind your trunk. 4. Slowly lower your top leg toward the floor until you feel a gentle stretch on the outside of your left / right hip and thigh. If you do not feel a stretch and your knee  will not fall farther, place the heel of your other foot on top of your knee and pull your knee down toward the floor with your foot. 5. Hold this position for __________ seconds. Repeat __________ times. Complete this exercise __________ times a day. Strengthening exercises These exercises build strength and endurance in your hip and pelvis. Endurance is the ability to use your muscles for a long time, even after they get tired. Straight leg raises, side-lying This exercise strengthens the muscles that rotate the leg at the hip and move it away from your body (hip abductors). 1. Lie on your side with your left / right leg in the top position. Lie so your head, shoulder, hip, and knee line up. You may bend your bottom knee to help you balance. 2. Roll your hips slightly forward so your hips are stacked directly over each other and your left / right knee is facing forward. 3. Tense the muscles in your outer thigh and lift your top leg 4-6 inches (10-15 cm). 4. Hold this position for __________ seconds. 5. Slowly return to the starting position. Let your muscles relax completely before doing another repetition. Repeat __________ times. Complete this exercise __________ times a day. Leg raises, prone This exercise strengthens the muscles that move the hips (hip extensors). 1. Lie on your abdomen on your bed or a firm surface. You can put a pillow under your hips if that is more comfortable for your lower back. 2. Bend your left / right knee so your foot is straight up in the air. 3. Squeeze your buttocks muscles and lift your left / right thigh off the bed. Do not let your back arch. 4. Tense your thigh muscle as hard as you can without increasing any knee pain. 5. Hold this position for __________ seconds. 6. Slowly lower your leg to the starting position and allow it to relax completely. Repeat __________ times. Complete this exercise __________ times a day. Hip hike 1. Stand sideways on a  bottom step. Stand on your left / right leg with your other foot unsupported next to the step. You can hold on to the railing or wall for balance if needed. 2. Keep your knees straight and your torso square. Then lift your left / right hip up toward the ceiling. 3. Slowly let your left / right hip lower toward the floor, past the starting position. Your foot should get closer to the floor. Do not lean or bend your knees. Repeat __________ times. Complete this exercise __________ times a day.  This information is not intended to replace advice given to you by your health care provider. Make sure you discuss any questions you have with your health care provider. Document Revised: 09/13/2018 Document Reviewed: 03/14/2018 Elsevier Patient Education  [place].

## 2019-09-04 NOTE — Progress Notes (Signed)
Subjective:     Evelyn Edwards is a 59 y.o. female and is here for a comprehensive physical exam. The patient reports problems - L leg pain. Pt notes left lateral thigh pain at night.  Patient endorses being a side sleeper.  Will wake up in the middle of the night and have to switch positions.  Pt denies muscle cramp/spasm, pain during the day, or joint pain.  Pt states her mattress is approximately 59 years old.    Pt notes increased stress dealing with her father's health- has a tumor in his nose.  Pt states he is stopped eating and drinking.   Pt has a h/o hysterectomy.  Endorses recent mammogram.  Colonoscopy up-to-date, done several years ago.  Requesting refill on tramadol.  Social History   Socioeconomic History  . Marital status: Single    Spouse name: Not on file  . Number of children: Not on file  . Years of education: Not on file  . Highest education level: Not on file  Occupational History  . Not on file  Tobacco Use  . Smoking status: Never Smoker  . Smokeless tobacco: Never Used  Substance and Sexual Activity  . Alcohol use: No  . Drug use: No  . Sexual activity: Not on file  Other Topics Concern  . Not on file  Social History Narrative  . Not on file   Social Determinants of Health   Financial Resource Strain:   . Difficulty of Paying Living Expenses:   Food Insecurity:   . Worried About Programme researcher, broadcasting/film/video in the Last Year:   . Barista in the Last Year:   Transportation Needs:   . Freight forwarder (Medical):   Marland Kitchen Lack of Transportation (Non-Medical):   Physical Activity:   . Days of Exercise per Week:   . Minutes of Exercise per Session:   Stress:   . Feeling of Stress :   Social Connections:   . Frequency of Communication with Friends and Family:   . Frequency of Social Gatherings with Friends and Family:   . Attends Religious Services:   . Active Member of Clubs or Organizations:   . Attends Banker Meetings:   Marland Kitchen  Marital Status:   Intimate Partner Violence:   . Fear of Current or Ex-Partner:   . Emotionally Abused:   Marland Kitchen Physically Abused:   . Sexually Abused:    Health Maintenance  Topic Date Due  . Hepatitis C Screening  Never done  . PAP SMEAR-Modifier  Never done  . TETANUS/TDAP  10/30/2017  . HIV Screening  09/03/2020 (Originally [birth year]1975)  . MAMMOGRAM  12/13/2020  . COLONOSCOPY  06/27/2023  . INFLUENZA VACCINE  Completed    The following portions of the patient's history were reviewed and updated as appropriate: allergies, current medications, past family history, past medical history, past social history, past surgical history and problem list.  Review of Systems Pertinent items noted in HPI and remainder of comprehensive ROS otherwise negative.   Objective:    BP 102/60 (BP Location: Left Arm, Patient Position: Sitting, Cuff Size: Large)   Pulse 63   Temp (!) 96.4 F (35.8 C) (Temporal)   Ht 5\' 4"  (1.626 m)   Wt 182 lb 12.8 oz (82.9 kg)   SpO2 98%   BMI 31.38 kg/m  General appearance: alert, cooperative and no distress Head: Normocephalic, without obvious abnormality, atraumatic Eyes: conjunctivae/corneas clear. PERRL, EOM's intact. Fundi benign. Ears: normal TM's and  external ear canals both ears Nose: Nares normal. Septum midline. Mucosa normal. No drainage or sinus tenderness. Throat: lips, mucosa, and tongue normal; teeth and gums normal Neck: no adenopathy, no carotid bruit, no JVD, supple, symmetrical, trachea midline and thyroid not enlarged, symmetric, no tenderness/mass/nodules Back: symmetric, no curvature. ROM normal. No CVA tenderness. Lungs: clear to auscultation bilaterally Heart: regular rate and rhythm, S1, S2 normal, no murmur, click, rub or gallop Abdomen: soft, non-tender; bowel sounds normal; no masses,  no organomegaly Extremities: extremities normal, atraumatic, no cyanosis or edema Pulses: 2+ and symmetric Skin: Skin color, texture, turgor normal. No  rashes or lesions Lymph nodes: Cervical, supraclavicular, and axillary nodes normal. Neurologic: Alert and oriented X 3, normal strength and tone. Normal symmetric reflexes. Normal coordination and gait    Assessment:    Healthy female exam with L LE pain.     Plan:     Anticipatory guidance given including wearing seatbelts, smoke detectors in the home, increasing physical activity, increasing p.o. intake of water and vegetables. -will obtain labs -h/o hysterectomy -Tdap given this visit -mammogram up to date.  Continue 12/14/2018 2010 -colonoscopy due 06/27/23 -given handout -next CPE in 1 yr See After Visit Summary for Counseling Recommendations    Screening for cholesterol level  - Plan: Lipid panel  Left leg pain -Discussed possible causes including nerve compression, vitamin deficiency, arthritis, medications-however not on statin -Discussed stretching and supportive care -Consider modifying sleeping position -Consider imaging with continued symptoms - Plan: CBC with Differential/Platelet, Basic metabolic panel, Hemoglobin A1c  Need for hepatitis C screening test  - Plan: Hep C Antibody  Need for diphtheria-tetanus-pertussis (Tdap) vaccine  - Plan: Tdap vaccine greater than or equal to 7yo IM  F/u prn  Grier Mitts, MD

## 2019-09-05 LAB — HEPATITIS C ANTIBODY
Hepatitis C Ab: NONREACTIVE
SIGNAL TO CUT-OFF: 0.25 (ref <1.00)

## 2019-10-28 ENCOUNTER — Other Ambulatory Visit: Payer: Self-pay | Admitting: Family Medicine

## 2019-11-11 ENCOUNTER — Encounter: Payer: Self-pay | Admitting: Family Medicine

## 2019-11-11 ENCOUNTER — Ambulatory Visit (INDEPENDENT_AMBULATORY_CARE_PROVIDER_SITE_OTHER): Payer: BC Managed Care – PPO | Admitting: Family Medicine

## 2019-11-11 ENCOUNTER — Other Ambulatory Visit: Payer: Self-pay

## 2019-11-11 VITALS — BP 120/70 | HR 76 | Temp 97.9°F | Wt 187.0 lb

## 2019-11-11 DIAGNOSIS — H6121 Impacted cerumen, right ear: Secondary | ICD-10-CM | POA: Diagnosis not present

## 2019-11-11 DIAGNOSIS — G43709 Chronic migraine without aura, not intractable, without status migrainosus: Secondary | ICD-10-CM | POA: Diagnosis not present

## 2019-11-11 MED ORDER — ONDANSETRON 4 MG PO TBDP: 4.0000 mg | ORAL_TABLET | Freq: Three times a day (TID) | ORAL | 0 refills | Status: DC | PRN

## 2019-11-11 MED ORDER — KETOROLAC TROMETHAMINE 60 MG/2ML IM SOLN
60.0000 mg | Freq: Once | INTRAMUSCULAR | Status: AC
  Administered 2019-11-11: 60 mg via INTRAMUSCULAR

## 2019-11-11 MED ORDER — RIZATRIPTAN BENZOATE 10 MG PO TABS: 10.0000 mg | ORAL_TABLET | ORAL | 3 refills | Status: DC | PRN

## 2019-11-11 NOTE — Patient Instructions (Addendum)
Migraine Headache A migraine headache is an intense, throbbing pain on one side or both sides of the head. Migraine headaches may also cause other symptoms, such as nausea, vomiting, and sensitivity to light and noise. A migraine headache can last from 4 hours to 3 days. Talk with your doctor about what things may bring on (trigger) your migraine headaches. What are the causes? The exact cause of this condition is not known. However, a migraine may be caused when nerves in the brain become irritated and release chemicals that cause inflammation of blood vessels. This inflammation causes pain. This condition may be triggered or caused by:  Drinking alcohol.  Smoking.  Taking medicines, such as: ? Medicine used to treat chest pain (nitroglycerin). ? Birth control pills. ? Estrogen. ? Certain blood pressure medicines.  Eating or drinking products that contain nitrates, glutamate, aspartame, or tyramine. Aged cheeses, chocolate, or caffeine may also be triggers.  Doing physical activity. Other things that may trigger a migraine headache include:  Menstruation.  Pregnancy.  Hunger.  Stress.  Lack of sleep or too much sleep.  Weather changes.  Fatigue. What increases the risk? The following factors may make you more likely to experience migraine headaches:  Being a certain age. This condition is more common in people who are 25-55 years old.  Being female.  Having a family history of migraine headaches.  Being Caucasian.  Having a mental health condition, such as depression or anxiety.  Being obese. What are the signs or symptoms? The main symptom of this condition is pulsating or throbbing pain. This pain may:  Happen in any area of the head, such as on one side or both sides.  Interfere with daily activities.  Get worse with physical activity.  Get worse with exposure to bright lights or loud noises. Other symptoms may  include:  Nausea.  Vomiting.  Dizziness.  General sensitivity to bright lights, loud noises, or smells. Before you get a migraine headache, you may get warning signs (an aura). An aura may include:  Seeing flashing lights or having blind spots.  Seeing bright spots, halos, or zigzag lines.  Having tunnel vision or blurred vision.  Having numbness or a tingling feeling.  Having trouble talking.  Having muscle weakness. Some people have symptoms after a migraine headache (postdromal phase), such as:  Feeling tired.  Difficulty concentrating. How is this diagnosed? A migraine headache can be diagnosed based on:  Your symptoms.  A physical exam.  Tests, such as: ? CT scan or an MRI of the head. These imaging tests can help rule out other causes of headaches. ? Taking fluid from the spine (lumbar puncture) and analyzing it (cerebrospinal fluid analysis, or CSF analysis). How is this treated? This condition may be treated with medicines that:  Relieve pain.  Relieve nausea.  Prevent migraine headaches. Treatment for this condition may also include:  Acupuncture.  Lifestyle changes like avoiding foods that trigger migraine headaches.  Biofeedback.  Cognitive behavioral therapy. Follow these instructions at home: Medicines  Take over-the-counter and prescription medicines only as told by your health care provider.  Ask your health care provider if the medicine prescribed to you: ? Requires you to avoid driving or using heavy machinery. ? Can cause constipation. You may need to take these actions to prevent or treat constipation:  Drink enough fluid to keep your urine pale yellow.  Take over-the-counter or prescription medicines.  Eat foods that are high in fiber, such as beans, whole grains, and   fresh fruits and vegetables.  Limit foods that are high in fat and processed sugars, such as fried or sweet foods. Lifestyle  Do not drink alcohol.  Do not  use any products that contain nicotine or tobacco, such as cigarettes, e-cigarettes, and chewing tobacco. If you need help quitting, ask your health care provider.  Get at least 8 hours of sleep every night.  Find ways to manage stress, such as meditation, deep breathing, or yoga. General instructions      Keep a journal to find out what may trigger your migraine headaches. For example, write down: ? What you eat and drink. ? How much sleep you get. ? Any change to your diet or medicines.  If you have a migraine headache: ? Avoid things that make your symptoms worse, such as bright lights. ? It may help to lie down in a dark, quiet room. ? Do not drive or use heavy machinery. ? Ask your health care provider what activities are safe for you while you are experiencing symptoms.  Keep all follow-up visits as told by your health care provider. This is important. Contact a health care provider if:  You develop symptoms that are different or more severe than your usual migraine headache symptoms.  You have more than 15 headache days in one month. Get help right away if:  Your migraine headache becomes severe.  Your migraine headache lasts longer than 72 hours.  You have a fever.  You have a stiff neck.  You have vision loss.  Your muscles feel weak or like you cannot control them.  You start to lose your balance often.  You have trouble walking.  You faint.  You have a seizure. Summary  A migraine headache is an intense, throbbing pain on one side or both sides of the head. Migraines may also cause other symptoms, such as nausea, vomiting, and sensitivity to light and noise.  This condition may be treated with medicines and lifestyle changes. You may also need to avoid certain things that trigger a migraine headache.  Keep a journal to find out what may trigger your migraine headaches.  Contact your health care provider if you have more than 15 headache days in a  month or you develop symptoms that are different or more severe than your usual migraine headache symptoms. This information is not intended to replace advice given to you by your health care provider. Make sure you discuss any questions you have with your health care provider. Document Revised: 09/14/2018 Document Reviewed: 07/05/2018 Elsevier Patient Education  2020 [employer]  Earwax Buildup, Adult The ears produce a substance called earwax that helps keep bacteria out of the ear and protects the skin in the ear canal. Occasionally, earwax can build up in the ear and cause discomfort or hearing loss. What increases the risk? This condition is more likely to develop in people who:  Are female.  Are elderly.  Naturally produce more earwax.  Clean their ears often with cotton swabs.  Use earplugs often.  Use in-ear headphones often.  Wear hearing aids.  Have narrow ear canals.  Have earwax that is overly thick or sticky.  Have eczema.  Are dehydrated.  Have excess hair in the ear canal. What are the signs or symptoms? Symptoms of this condition include:  Reduced or muffled hearing.  A feeling of fullness in the ear or feeling that the ear is plugged.  Fluid coming from the ear.  Ear pain.  Ear itch.  Ringing in the ear.  Coughing.  An obvious piece of earwax that can be seen inside the ear canal. How is this diagnosed? This condition may be diagnosed based on:  Your symptoms.  Your medical history.  An ear exam. During the exam, your health care provider will look into your ear with an instrument called an otoscope. You may have tests, including a hearing test. How is this treated? This condition may be treated by:  Using ear drops to soften the earwax.  Having the earwax removed by a health care provider. The health care provider may: ? Flush the ear with water. ? Use an instrument that has a loop on the end (curette). ? Use a suction  device.  Surgery to remove the wax buildup. This may be done in severe cases. Follow these instructions at home:   Take over-the-counter and prescription medicines only as told by your health care provider.  Do not put any objects, including cotton swabs, into your ear. You can clean the opening of your ear canal with a washcloth or facial tissue.  Follow instructions from your health care provider about cleaning your ears. Do not over-clean your ears.  Drink enough fluid to keep your urine clear or pale yellow. This will help to thin the earwax.  Keep all follow-up visits as told by your health care provider. If earwax builds up in your ears often or if you use hearing aids, consider seeing your health care provider for routine, preventive ear cleanings. Ask your health care provider how often you should schedule your cleanings.  If you have hearing aids, clean them according to instructions from the manufacturer and your health care provider. Contact a health care provider if:  You have ear pain.  You develop a fever.  You have blood, pus, or other fluid coming from your ear.  You have hearing loss.  You have ringing in your ears that does not go away.  Your symptoms do not improve with treatment.  You feel like the room is spinning (vertigo). Summary  Earwax can build up in the ear and cause discomfort or hearing loss.  The most common symptoms of this condition include reduced or muffled hearing and a feeling of fullness in the ear or feeling that the ear is plugged.  This condition may be diagnosed based on your symptoms, your medical history, and an ear exam.  This condition may be treated by using ear drops to soften the earwax or by having the earwax removed by a health care provider.  Do not put any objects, including cotton swabs, into your ear. You can clean the opening of your ear canal with a washcloth or facial tissue. This information is not intended to  replace advice given to you by your health care provider. Make sure you discuss any questions you have with your health care provider. Document Revised: 05/05/2017 Document Reviewed: 08/03/2016 Elsevier Patient Education  2020 [employer].

## 2019-11-11 NOTE — Progress Notes (Signed)
Subjective:    Patient ID: Evelyn Edwards, female    DOB: [birth year], 59 y.o.   MRN: [id number]  No chief complaint on file.   HPI Pt is a 59 yo female with pmh sig for migraines and vertigo who was seen for current migraine.  Pt states headache started this morning with dull pain in frontal area of head, R>L.  Pt did not try any medications this morning.  Typically takes Topamax daily and Maxalt as needed.  Drinking more water.  Denies increased caffeine intake, trouble sleep, nausea, vomiting, sensitivity to light or sound.  Pt has a headache once every 2 weeks.  Pt's sister has migraines and her daughter has a h/o pseudotumor cerebri.  Pt denies ever having imaging in regards to her headaches.  Pt's father died 09/25/19.  While staying with her sister patient had a headache and did not have the medication.  She took one of her sisters oxycodone tabs for relief.  When she returned to work days later her random drug test was positive for opioids.  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  Past Medical History:  Diagnosis Date  . Anxiety   . Headache(784.0)     No Known Allergies  ROS General: Denies fever, chills, night sweats, changes in weight, changes in appetite HEENT: Denies ear pain, changes in vision, rhinorrhea, sore throat +migriane CV: Denies CP, palpitations, SOB, orthopnea Pulm: Denies SOB, cough, wheezing GI: Denies abdominal pain, nausea, vomiting, diarrhea, constipation GU: Denies dysuria, hematuria, frequency, vaginal discharge Msk: Denies muscle cramps, joint pains Neuro: Denies weakness, numbness, tingling Skin: Denies rashes, bruising Psych: Denies depression, anxiety, hallucinations    Objective:    Blood pressure 120/70, pulse 76, temperature 97.9 F (36.6 C), temperature source Temporal, weight 187 lb (84.8 kg), SpO2 97 %.  Gen. Pleasant, well-nourished, in no distress, normal affect   HEENT:  [place]/AT, face symmetric, conjunctiva clear, no scleral icterus, PERRLA, EOMI, nares patent without drainage, pharynx with mild erythema on L, no exudate.  L TM normal.  R canal occluded with cerumen.  No cervical lymphadenopathy. Lungs: no accessory muscle use, CTAB, no wheezes or rales Cardiovascular: RRR, no m/r/g, no peripheral edema Musculoskeletal: No deformities, no cyanosis or clubbing, normal tone Neuro:  A&Ox3, CN II-XII intact, normal gait Skin:  Warm, no lesions/ rash  Wt Readings from Last 3 Encounters:  09/04/19 182 lb 12.8 oz (82.9 kg)  08/07/19 186 lb 2 oz (84.4 kg)  07/24/19 183 lb (83 kg)    Lab Results  Component Value Date   WBC 4.3 09/04/2019   HGB 12.9 09/04/2019   HCT 38.9 09/04/2019   PLT 219.0 09/04/2019   GLUCOSE 89 09/04/2019   CHOL 168 09/04/2019   TRIG 100.0 09/04/2019   HDL 47.30 09/04/2019   LDLCALC 101 (H) 09/04/2019   ALT 12 07/04/2017   AST 13 07/04/2017   NA 140 09/04/2019   K 4.3 09/04/2019   CL 109 09/04/2019   CREATININE 0.94 09/04/2019   BUN 16 09/04/2019   CO2 23 09/04/2019   TSH 0.43 07/04/2017   HGBA1C 5.8 09/04/2019    Assessment/Plan:  Chronic migraine without aura without status migrainosus, not intractable  -Discussed headache prevention -Given Toradol IM in clinic -Given handout -We will discontinue Maxalt 5 mg and increase dose to 10 mg as needed -Continue Topamax 100 mg twice daily -We will place referral to neurology - Plan: Ambulatory  referral to Neurology, ketorolac (TORADOL) injection 60 mg, ondansetron (ZOFRAN ODT) 4 MG disintegrating tablet  Impacted cerumen of right ear -Consider Debrox -Given handout  F/u as needed  Grier Mitts, MD

## 2019-11-12 ENCOUNTER — Encounter: Payer: Self-pay | Admitting: Neurology

## 2019-11-13 ENCOUNTER — Encounter: Payer: Self-pay | Admitting: Family Medicine

## 2019-11-14 NOTE — Telephone Encounter (Signed)
Can the referral be placed at this office? Please advise

## 2019-11-22 ENCOUNTER — Other Ambulatory Visit: Payer: Self-pay

## 2019-11-22 DIAGNOSIS — G43709 Chronic migraine without aura, not intractable, without status migrainosus: Secondary | ICD-10-CM

## 2020-01-21 NOTE — Progress Notes (Signed)
[id number]    Provider:  Dr Lucia Gaskins Requesting Provider: Deeann Saint, MD Primary Care Provider:  Deeann Saint, MD  CC:  migraines  HPI:  Evelyn Edwards is a 59 y.o. female here as requested by Deeann Saint, MD for migraines.  Past medical history anxiety, headache, migraine.  I reviewed Dr. Salomon Fick notes, patient has migraines and vertigo, she was last seen after having an acute headache with dull pain in the frontal area right greater than left, typically takes Topamax daily and Maxalt as needed, she reported drinking more water and denied increased caffeine intake, trouble sleeping, nausea, vomiting, sensitivity to light or sound, she has a headache once every 2 weeks and her sister has migraines and her daughter has a history of pseudotumor cerebri.  Patient denies ever having images in regards to her headaches.  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  Patient does have a history of migraines and I found notes from 2019 where she was also taking Maxalt and topiramate at that time 100 mg in the morning and 120 5 in the afternoon, she was also taking tramadol as needed for headaches, and at that time reported a migraine a few times a week or more frequently.  In a note from 2019 and even prior in 2018 by Dr. Tawanna Cooler she was also diagnosed with chronic cluster headaches however there may be a missed diagnosis as I do not see anything in the note regarding cluster, and I see notes as far back as 2015 with headaches.   She is here alone. Migraines started worsening 2010 but she has had them since she was young, started worenin gin 2010 with a stressful job. Slowly progressive. Starts in the forehead and temples, severe throbbing/pulsating, nausea, no vomiting,  +photophonoa/phonophobia, movement makes it worse. Can last 24 hours. Laying down in a dark room helps. They can be severe. She has them 1x a week, no other headaches. 2-3x a month, they can be severe and keep her out of work. Tried maxalt and imitrex and they don;t work anymore. Tramadol helps. Prior to the topamax she was having at least 15 migraine days a month. No side effects to the topamax. She has methocarbamol as well. She knows about goodrx and we will help with cone financial assistance today she is self pay. Not positional or exertional, no vision problems. No other focal neurologic deficits, associated symptoms, inciting events or modifiable factors.  Reviewed notes, labs and imaging from outside physicians, which showed: see above  BMP/cbc 09/04/2019 normal  Review of Systems: Patient complains of symptoms per HPI as well as the following symptoms: headaches. Pertinent negatives and positives per HPI. All others negative.   Social History   Socioeconomic History  . Marital status: Single    Spouse name: Not on file  . Number of children: Not on file  . Years of education: Not on file  . Highest education level: Not on file  Occupational History  . Not on file  Tobacco Use  . Smoking status: Never Smoker  . Smokeless tobacco: Never Used  Substance and Sexual Activity  . Alcohol use: No  . Drug use: No  . Sexual activity: Not on file  Other Topics Concern  . Not on  file  Social History Narrative  . Not on file   Social Determinants of Health   Financial Resource Strain:   . Difficulty of Paying Living Expenses:   Food Insecurity:   . Worried About Programme researcher, broadcasting/film/videounning Out of Food in the Last Year:   . Baristaan Out of Food in the Last Year:   Transportation Needs:   . Freight forwarderLack of Transportation (Medical):   Marland Kitchen. Lack of Transportation (Non-Medical):   Physical Activity:   . Days of Exercise per Week:   . Minutes of Exercise per Session:   Stress:   . Feeling of Stress :   Social  Connections:   . Frequency of Communication with Friends and Family:   . Frequency of Social Gatherings with Friends and Family:   . Attends Religious Services:   . Active Member of Clubs or Organizations:   . Attends BankerClub or Organization Meetings:   Marland Kitchen. Marital Status:   Intimate Partner Violence:   . Fear of Current or Ex-Partner:   . Emotionally Abused:   Marland Kitchen. Physically Abused:   . Sexually Abused:     Family History  Problem Relation Age of Onset  . Hypertension Father   . Diabetes Father   . Coronary artery disease Father   . Colon polyps Father   . Asthma Mother   . Lupus Sister   . Migraines Sister   . Anxiety disorder Other     Past Medical History:  Diagnosis Date  . Anxiety   . JYNWGNFA(213.0Headache(784.0)     Patient Active Problem List   Diagnosis Date Noted  . Migraine without aura and without status migrainosus, not intractable 01/22/2020  . Weight loss 07/04/2017  . Benign paroxysmal positional vertigo 10/29/2014  . BREAST MASS, RIGHT 05/28/2009  . HEADACHE 10/31/2007    Past Surgical History:  Procedure Laterality Date  . ABDOMINAL HYSTERECTOMY     ovaries were left intact    Current Outpatient Medications  Medication Sig Dispense Refill  . methocarbamol (ROBAXIN) 500 MG tablet Take 1 tablet (500 mg total) by mouth every 6 (six) hours as needed for muscle spasms. 30 tablet 0  . ondansetron (ZOFRAN ODT) 4 MG disintegrating tablet Take 1 tablet (4 mg total) by mouth every 8 (eight) hours as needed for nausea or vomiting. 20 tablet 0  . rizatriptan (MAXALT) 10 MG tablet Take 1 tablet (10 mg total) by mouth as needed for migraine. May repeat in 2 hours if needed 10 tablet 3  . topiramate (TOPAMAX) 100 MG tablet TAKE 1 TABLET BY MOUTH TWICE DAILY 180 tablet 3  . traMADol (ULTRAM) 50 MG tablet TAKE 1 TABLET(50 MG) BY MOUTH EVERY 8 HOURS AS NEEDED 60 tablet 3  . triamcinolone cream (KENALOG) 0.1 % Apply 1 application topically 2 (two) times daily. 30 g 2  . naratriptan  (AMERGE) 2.5 MG tablet Take 1 tablet (2.5 mg total) by mouth as needed for migraine. Take one (1) tablet at onset of headache; if returns or does not resolve, may repeat after 2 hours; do not exceed five (5) mg in 24 hours. 9 tablet 11  . Rimegepant Sulfate (NURTEC) 75 MG TBDP Take 75 mg by mouth daily as needed. For migraines. Take as close to onset of migraine as possible. One daily maximum. 10 tablet 0   No current facility-administered medications for this visit.    Allergies as of 01/22/2020  . (No Known Allergies)    Vitals: BP 127/60   Pulse 64   Ht 5'  5" (1.651 m)   Wt 192 lb (87.1 kg)   BMI 31.95 kg/m  Last Weight:  Wt Readings from Last 1 Encounters:  01/22/20 192 lb (87.1 kg)   Last Height:   Ht Readings from Last 1 Encounters:  01/22/20 5\' 5"  (1.651 m)     Physical exam: Exam: Gen: NAD, conversant, well nourised, obese, well groomed                     CV: RRR, no MRG. No Carotid Bruits. No peripheral edema, warm, nontender Eyes: Conjunctivae clear without exudates or hemorrhage  Neuro: Detailed Neurologic Exam  Speech:    Speech is normal; fluent and spontaneous with normal comprehension.  Cognition:    The patient is oriented to person, place, and time;     recent and remote memory intact;     language fluent;     normal attention, concentration,     fund of knowledge Cranial Nerves:    The pupils are equal, round, and reactive to light. The fundi are flat Visual fields are full to finger confrontation. Extraocular movements are intact. Trigeminal sensation is intact and the muscles of mastication are normal. The face is symmetric. The palate elevates in the midline. Hearing intact. Voice is normal. Shoulder shrug is normal. The tongue has normal motion without fasciculations.   Coordination:    No dysmetria or ataxia  Gait:    Normal native gait  Motor Observation:    No asymmetry, no atrophy, and no involuntary movements noted. Tone:    Normal  muscle tone.    Posture:    Posture is normal. normal erect    Strength:    Strength is V/V in the upper and lower limbs.      Sensation: intact to LT     Reflex Exam:  DTR's:    Deep tendon reflexes in the upper and lower extremities are symmetrical bilaterally.   Toes:    The toes are equiv bilaterally.   Clonus:    Clonus is absent.    Assessment/Plan:  Lovely patient with episodic migraines, doing well on Topamax but needs changes to acute management.   At onset of migraine: Take an amerge, a zofran and alleve. In 2 hours if not better can take another amerge(Naratriptan). Also if not better can also take a Nurtec(samples) We helped her today with .  Discussed: To prevent or relieve headaches, try the following: Cool Compress. Lie down and place a cool compress on your head.  Avoid headache triggers. If certain foods or odors seem to have triggered your migraines in the past, avoid them. A headache diary might help you identify triggers.  Include physical activity in your daily routine. Try a daily walk or other moderate aerobic exercise.  Manage stress. Find healthy ways to cope with the stressors, such as delegating tasks on your to-do list.  Practice relaxation techniques. Try deep breathing, yoga, massage and visualization.  Eat regularly. Eating regularly scheduled meals and maintaining a healthy diet might help prevent headaches. Also, drink plenty of fluids.  Follow a regular sleep schedule. Sleep deprivation might contribute to headaches Consider biofeedback. With this mind-body technique, you learn to control certain bodily functions -- such as muscle tension, heart rate and blood pressure -- to prevent headaches or reduce headache pain.    Proceed to emergency room if you experience new or worsening symptoms or symptoms do not resolve, if you have new neurologic symptoms or if  headache is severe, or for any concerning symptom.   Provided  education and documentation from American headache Society toolbox including articles on: chronic migraine medication overuse headache, chronic migraines, prevention of migraines, behavioral and other nonpharmacologic treatments for headache.   No orders of the defined types were placed in this encounter.  Meds ordered this encounter  Medications  . Rimegepant Sulfate (NURTEC) 75 MG TBDP    Sig: Take 75 mg by mouth daily as needed. For migraines. Take as close to onset of migraine as possible. One daily maximum.    Dispense:  10 tablet    Refill:  0  . naratriptan (AMERGE) 2.5 MG tablet    Sig: Take 1 tablet (2.5 mg total) by mouth as needed for migraine. Take one (1) tablet at onset of headache; if returns or does not resolve, may repeat after 2 hours; do not exceed five (5) mg in 24 hours.    Dispense:  9 tablet    Refill:  11    Cc: Deeann Saint, MD,  Deeann Saint, MD  Naomie Dean, MD  [facility] [address] [place], [place] [address]  Phone [phone] Fax [phone]

## 2020-01-22 ENCOUNTER — Encounter: Payer: Self-pay | Admitting: Neurology

## 2020-01-22 ENCOUNTER — Ambulatory Visit: Payer: Self-pay | Admitting: Neurology

## 2020-01-22 VITALS — BP 127/60 | HR 64 | Ht 65.0 in | Wt 192.0 lb

## 2020-01-22 DIAGNOSIS — G43009 Migraine without aura, not intractable, without status migrainosus: Secondary | ICD-10-CM | POA: Insufficient documentation

## 2020-01-22 MED ORDER — NURTEC 75 MG PO TBDP: 75.0000 mg | ORAL_TABLET | Freq: Every day | ORAL | 0 refills | Status: DC | PRN

## 2020-01-22 MED ORDER — NARATRIPTAN HCL 2.5 MG PO TABS: 2.5000 mg | ORAL_TABLET | ORAL | 11 refills | Status: DC | PRN

## 2020-01-22 NOTE — Patient Instructions (Signed)
At onset of migraine: Take an amerge(Naratriptan), a zofran(ondansetron) and alleve. In 2 hours if not better can take another amerge(Naratriptan). Also if not better can also take a Nurtec(samples)  Rimegepant oral dissolving tablet What is this medicine? RIMEGEPANT (ri ME je pant) is used to treat migraine headaches with or without aura. An aura is a strange feeling or visual disturbance that warns you of an attack. It is not used to prevent migraines. This medicine may be used for other purposes; ask your health care provider or pharmacist if you have questions. COMMON BRAND NAME(S): NURTEC ODT What should I tell my health care provider before I take this medicine? They need to know if you have any of these conditions:  kidney disease  liver disease  an unusual or allergic reaction to rimegepant, other medicines, foods, dyes, or preservatives  pregnant or trying to get pregnant  breast-feeding How should I use this medicine? Take the medicine by mouth. Follow the directions on the prescription label. Leave the tablet in the sealed blister pack until you are ready to take it. With dry hands, open the blister and gently remove the tablet. If the tablet breaks or crumbles, throw it away and take a new tablet out of the blister pack. Place the tablet in the mouth and allow it to dissolve, and then swallow. Do not cut, crush, or chew this medicine. You do not need water to take this medicine. Talk to your pediatrician about the use of this medicine in children. Special care may be needed. Overdosage: If you think you have taken too much of this medicine contact a poison control center or emergency room at once. NOTE: This medicine is only for you. Do not share this medicine with others. What if I miss a dose? This does not apply. This medicine is not for regular use. What may interact with this medicine? This medicine may interact with the following medications:  certain medicines for  fungal infections like fluconazole, itraconazole  rifampin This list may not describe all possible interactions. Give your health care provider a list of all the medicines, herbs, non-prescription drugs, or dietary supplements you use. Also tell them if you smoke, drink alcohol, or use illegal drugs. Some items may interact with your medicine. What should I watch for while using this medicine? Visit your health care professional for regular checks on your progress. Tell your health care professional if your symptoms do not start to get better or if they get worse. What side effects may I notice from receiving this medicine? Side effects that you should report to your doctor or health care professional as soon as possible:  allergic reactions like skin rash, itching or hives; swelling of the face, lips, or tongue Side effects that usually do not require medical attention (report these to your doctor or health care professional if they continue or are bothersome):  nausea This list may not describe all possible side effects. Call your doctor for medical advice about side effects. You may report side effects to FDA at 1-800-FDA-1088. Where should I keep my medicine? Keep out of the reach of children. Store at room temperature between 15 and 30 degrees C (59 and 86 degrees F). Throw away any unused medicine after the expiration date. NOTE: This sheet is a summary. It may not cover all possible information. If you have questions about this medicine, talk to your doctor, pharmacist, or health care provider.  2020 Elsevier/Gold Standard (2018-08-06 00:21:31)  Naratriptan tablets What  is this medicine? NARATRIPTAN (NAR a trip tan) is used to treat migraines with or without aura. An aura is a strange feeling or visual disturbance that warns you of an attack. It is not used to prevent migraines. This medicine may be used for other purposes; ask your health care provider or pharmacist if you have  questions. COMMON BRAND NAME(S): Amerge What should I tell my health care provider before I take this medicine? They need to know if you have any of these conditions:  cigarette smoker  circulation problems in fingers and toes  diabetes  heart disease  high blood pressure  high cholesterol  history of irregular heartbeat  history of stroke  kidney disease  liver disease  stomach or intestine problems  an unusual or allergic reaction to naratriptan, other medicines, foods, dyes, or preservatives  pregnant or trying to get pregnant  breast-feeding How should I use this medicine? Take this medicine by mouth with a glass of water. Follow the directions on the prescription label. Do not take it more often than directed. Talk to your pediatrician regarding the use of this medicine in children. Special care may be needed. Overdosage: If you think you have taken too much of this medicine contact a poison control center or emergency room at once. NOTE: This medicine is only for you. Do not share this medicine with others. What if I miss a dose? This does not apply. This medicine is not for regular use. What may interact with this medicine? Do not take this medicine with any of the following medicines:  ergot alkaloids like dihydroergotamine, ergonovine, ergotamine, methylergonovine  certain medicines for migraine headache like almotriptan, eletriptan, frovatriptan, naratriptan, rizatriptan, sumatriptan, zolmitriptan This medicine may also interact with the following medications:  certain medicines for depression, anxiety, or psychotic disorders This list may not describe all possible interactions. Give your health care provider a list of all the medicines, herbs, non-prescription drugs, or dietary supplements you use. Also tell them if you smoke, drink alcohol, or use illegal drugs. Some items may interact with your medicine. What should I watch for while using this  medicine? Visit your healthcare professional for regular checks on your progress. Tell your healthcare professional if your symptoms do not start to get better or if they get worse. You may get drowsy or dizzy. Do not drive, use machinery, or do anything that needs mental alertness until you know how this medicine affects you. Do not stand up or sit up quickly, especially if you are an older patient. This reduces the risk of dizzy or fainting spells. Alcohol may interfere with the effect of this medicine. Tell your healthcare professional right away if you have any change in your eyesight. If you take migraine medicines for 10 or more days a month, your migraines may get worse. Keep a diary of headache days and medicine use. Contact your healthcare professional if your migraine attacks occur more frequently. What side effects may I notice from receiving this medicine? Side effects that you should report to your doctor or health care professional as soon as possible:  allergic reactions like skin rash, itching or hives, swelling of the face, lips, or tongue  changes in vision  chest pain or chest tightness  signs and symptoms of a dangerous change in heartbeat or heart rhythm like chest pain; dizziness; fast, irregular heartbeat; palpitations; feeling faint or lightheaded; falls; breathing problems  signs and symptoms of a stroke like changes in vision; confusion; trouble speaking or  understanding; severe headaches; sudden numbness or weakness of the face, arm or leg; trouble walking; dizziness; loss of balance or coordination  signs and symptoms of serotonin syndrome like irritable; confusion; diarrhea; fast or irregular heartbeat; muscle twitching; stiff muscles; trouble walking; sweating; high fever; seizures; chills; vomiting Side effects that usually do not require medical attention (report to your doctor or health care professional if they continue or are  bothersome):  diarrhea  dizziness  drowsiness  headache  nausea, vomiting  pain, tingling, numbness in the hands or feet  stomach pain This list may not describe all possible side effects. Call your doctor for medical advice about side effects. You may report side effects to FDA at 1-800-FDA-1088. Where should I keep my medicine? Keep out of the reach of children. Store at room temperature between 20 and 25 degrees C (68 and 77 degrees F). Throw away any unused medicine after the expiration date. NOTE: This sheet is a summary. It may not cover all possible information. If you have questions about this medicine, talk to your doctor, pharmacist, or health care provider.  2020 Elsevier/Gold Standard (2017-12-05 14:55:22)

## 2020-02-04 ENCOUNTER — Ambulatory Visit: Payer: BC Managed Care – PPO | Admitting: Neurology

## 2020-02-15 ENCOUNTER — Other Ambulatory Visit: Payer: Self-pay | Admitting: Family Medicine

## 2020-02-17 NOTE — Telephone Encounter (Signed)
Pt LOV was on 01/22/2020 and last refill was done on 09/04/2019 for 60 tablets with 3 refills, please advise

## 2020-03-27 ENCOUNTER — Other Ambulatory Visit: Payer: Self-pay | Admitting: Family Medicine

## 2020-03-27 DIAGNOSIS — G43709 Chronic migraine without aura, not intractable, without status migrainosus: Secondary | ICD-10-CM

## 2020-03-31 MED ORDER — ONDANSETRON 4 MG PO TBDP: 4.0000 mg | ORAL_TABLET | Freq: Three times a day (TID) | ORAL | 2 refills | Status: DC | PRN

## 2020-04-04 ENCOUNTER — Other Ambulatory Visit: Payer: Self-pay

## 2020-04-04 ENCOUNTER — Encounter: Payer: Self-pay | Admitting: Family Medicine

## 2020-04-04 ENCOUNTER — Telehealth (INDEPENDENT_AMBULATORY_CARE_PROVIDER_SITE_OTHER): Payer: BC Managed Care – PPO | Admitting: Family Medicine

## 2020-04-04 DIAGNOSIS — M545 Low back pain, unspecified: Secondary | ICD-10-CM

## 2020-04-04 MED ORDER — METHOCARBAMOL 500 MG PO TABS: 500.0000 mg | ORAL_TABLET | Freq: Three times a day (TID) | ORAL | 0 refills | Status: DC | PRN

## 2020-04-04 NOTE — Assessment & Plan Note (Addendum)
Acute lower back pain after lifting heavy package at work. No red flags. Reviewed tylenol/ibuprofen dosing (rec ibuprofen 600mg  three times daily with meals, may use tylenol 500-1000mg  in between as needed). Rx robaxin muscle relaxant course. She also has tramadol she may use sparingly for breakthrough pain. Continue heating pad, gentle stretching.  rec light duty restrictions at work for 1 wk, work provided. RTC if not improving with treatment, for physical exam, consider need for imaging. Pt agrees with plan.

## 2020-04-04 NOTE — Progress Notes (Signed)
Virtual visit completed through MyChart, a video enabled telemedicine application. Due to national recommendations of social distancing due to COVID-19, a virtual visit is felt to be most appropriate for this patient at this time. Reviewed limitations, risks, security and privacy concerns of performing a virtual visit and the availability of in person appointments. I also reviewed that there may be a patient responsible charge related to this service. The patient agreed to proceed.   Patient location: home Provider location: [place] at [facility], office Persons participating in this virtual visit: patient, provider   If any vitals were documented, they were collected by patient at home unless specified below.    BP (!) 144/69   Pulse (!) 55   Temp 98 F (36.7 C) (Temporal)   Ht 5\' 5"  (1.651 m)   Wt 180 lb (81.6 kg)   BMI 29.95 kg/m    CC: lower back pain  Subjective:    Patient ID: , female    DOB: May 18, 1961, 59 y.o.   MRN: [id number]  HPI: Evelyn Edwards is a 59 y.o. female presenting on 04/04/2020 for Back Pain (low back pain denies any radicular pain after lifting packages all day. x 4 day )   Very pleasant patient of Dr 04/06/2020 presents today with 4d h/o lower back pain that started after she delivered heavy heater up 3 flights of stairs (works for [employer]). Describes aching throbbing pain band like across lower back. Missed work later this week due to back pain. Denies shooting pain down legs. No numbness or weakness of legs, no fevers, saddle anesthesia, new bowel/bladder incontinence. Denies other inciting trauma/fall. At its worse 10/10 pain, currently 7/10. Worse pain with prolonged sitting or transitions from sitting to standing, discomfort to palpation at middle of the back.   No prior h/o back pain or back surgeries.   Managing pain with heat, ibuprofen 800mg  twice daily with temporary relief.  No known h/o osteoporosis but mother has h/o this.    Has tramadol at home, hasn't tried this yet.        Relevant past medical, surgical, family and social history reviewed and updated as indicated. Interim medical history since our last visit reviewed. Allergies and medications reviewed and updated. Outpatient Medications Prior to Visit  Medication Sig Dispense Refill  . naratriptan (AMERGE) 2.5 MG tablet Take 1 tablet (2.5 mg total) by mouth as needed for migraine. Take one (1) tablet at onset of headache; if returns or does not resolve, may repeat after 2 hours; do not exceed five (5) mg in 24 hours. 9 tablet 11  . ondansetron (ZOFRAN ODT) 4 MG disintegrating tablet Take 1 tablet (4 mg total) by mouth every 8 (eight) hours as needed for nausea or vomiting. 20 tablet 2  . Rimegepant Sulfate (NURTEC) 75 MG TBDP Take 75 mg by mouth daily as needed. For migraines. Take as close to onset of migraine as possible. One daily maximum. 10 tablet 0  . rizatriptan (MAXALT) 10 MG tablet Take 1 tablet (10 mg total) by mouth as needed for migraine. May repeat in 2 hours if needed 10 tablet 3  . topiramate (TOPAMAX) 100 MG tablet TAKE 1 TABLET BY MOUTH TWICE DAILY 180 tablet 3  . traMADol (ULTRAM) 50 MG tablet TAKE 1 TABLET(50 MG) BY MOUTH EVERY 8 HOURS AS NEEDED 60 tablet 3  . triamcinolone cream (KENALOG) 0.1 % Apply 1 application topically 2 (two) times daily. 30 g 2  . methocarbamol (ROBAXIN) 500 MG  tablet Take 1 tablet (500 mg total) by mouth every 6 (six) hours as needed for muscle spasms. 30 tablet 0   No facility-administered medications prior to visit.     Per HPI unless specifically indicated in ROS section below Review of Systems Objective:  BP (!) 144/69   Pulse (!) 55   Temp 98 F (36.7 C) (Temporal)   Ht 5\' 5"  (1.651 m)   Wt 180 lb (81.6 kg)   BMI 29.95 kg/m   Wt Readings from Last 3 Encounters:  04/04/20 180 lb (81.6 kg)  01/22/20 192 lb (87.1 kg)  11/11/19 187 lb (84.8 kg)       Physical exam: Gen: alert, NAD, not ill  appearing Pulm: speaks in complete sentences without increased work of breathing Psych: normal mood, normal thought content      Lab Results  Component Value Date   CREATININE 0.94 09/04/2019   BUN 16 09/04/2019   NA 140 09/04/2019   K 4.3 09/04/2019   CL 109 09/04/2019   CO2 23 09/04/2019    Assessment & Plan:   Problem List Items Addressed This Visit    Acute bilateral low back pain    Acute lower back pain after lifting heavy package at work. No red flags. Reviewed tylenol/ibuprofen dosing (rec ibuprofen 600mg  three times daily with meals, may use tylenol 500-1000mg  in between as needed). Rx robaxin muscle relaxant course. She also has tramadol she may use sparingly for breakthrough pain. Continue heating pad, gentle stretching.  rec light duty restrictions at work for 1 wk, work 09/06/2019 provided. RTC if not improving with treatment, for physical exam, consider need for imaging. Pt agrees with plan.       Relevant Medications   methocarbamol (ROBAXIN) 500 MG tablet       Meds ordered this encounter  Medications  . methocarbamol (ROBAXIN) 500 MG tablet    Sig: Take 1 tablet (500 mg total) by mouth 3 (three) times daily as needed for muscle spasms.    Dispense:  20 tablet    Refill:  0   No orders of the defined types were placed in this encounter.   I discussed the assessment and treatment plan with the patient. The patient was provided an opportunity to ask questions and all were answered. The patient agreed with the plan and demonstrated an understanding of the instructions. The patient was advised to call back or seek an in-person evaluation if the symptoms worsen or if the condition fails to improve as anticipated.  Follow up plan: No follow-ups on file.  , MD

## 2020-08-11 ENCOUNTER — Other Ambulatory Visit: Payer: Self-pay | Admitting: Family Medicine

## 2020-08-11 ENCOUNTER — Other Ambulatory Visit: Payer: Self-pay

## 2020-08-12 MED ORDER — NURTEC 75 MG PO TBDP
75.0000 mg | ORAL_TABLET | Freq: Every day | ORAL | 0 refills | Status: DC | PRN
Start: 2020-08-12 — End: 2021-02-04

## 2020-08-12 NOTE — Telephone Encounter (Signed)
Last filled 07-05-20 #60 Last OV 11-11-19 No Future OV [facility]

## 2020-08-17 NOTE — Telephone Encounter (Signed)
traMADol (ULTRAM) 50 MG tablet    [facility] [phone] - [place], [place] - [address] Phone:  [phone]  Fax:  [phone]

## 2020-09-09 ENCOUNTER — Other Ambulatory Visit: Payer: Self-pay

## 2020-09-09 ENCOUNTER — Encounter: Payer: Self-pay | Admitting: Family Medicine

## 2020-09-09 ENCOUNTER — Ambulatory Visit (INDEPENDENT_AMBULATORY_CARE_PROVIDER_SITE_OTHER): Payer: Self-pay | Admitting: Family Medicine

## 2020-09-09 VITALS — BP 146/78 | HR 76 | Temp 98.3°F | Wt 178.8 lb

## 2020-09-09 DIAGNOSIS — T7840XA Allergy, unspecified, initial encounter: Secondary | ICD-10-CM

## 2020-09-09 DIAGNOSIS — K0889 Other specified disorders of teeth and supporting structures: Secondary | ICD-10-CM

## 2020-09-09 DIAGNOSIS — H6123 Impacted cerumen, bilateral: Secondary | ICD-10-CM

## 2020-09-09 DIAGNOSIS — K047 Periapical abscess without sinus: Secondary | ICD-10-CM

## 2020-09-09 NOTE — Progress Notes (Addendum)
Subjective:    Patient ID: Evelyn Edwards, female    DOB: 05/08/1961, 60 y.o.   MRN: [id number]  Chief Complaint  Patient presents with  . Medication Reaction    HPI Patient was seen today for acute concern.  Pt endorses hives, sore throat, fever Tmax 101 F, chills, dizziness, redness of tongue and lips after a second round of amoxicillin.  Pt notes initially on amoxicillin at the end of March, as prescribed by her dentist, Dr. Hilda Blades for tooth infection.  Patient is unable to have tooth pulled by specialist until 10/06/2020.  Symptoms started a few days after being on a second round of amoxicillin.  Has never had issues with amoxicillin in the past.  She stopped the medication and was switched to clindamycin without issue.  Notes tooth is no longer sore.  L ear pain developed.   Using cortisone and benadryl.  In the past patient able to take amoxicillin without issue.  States her granddaughter has a penicillin allergy.  Past Medical History:  Diagnosis Date  . Anxiety   . Headache(784.0)     No Known Allergies  ROS General: Denies fever, chills, night sweats, changes in weight, changes in appetite + chills,  HEENT: Denies headaches, ear pain, changes in vision, rhinorrhea, sore throat  +dental pain, L ear pain, fever CV: Denies CP, palpitations, SOB, orthopnea Pulm: Denies SOB, cough, wheezing GI: Denies abdominal pain, nausea, vomiting, diarrhea, constipation GU: Denies dysuria, hematuria, frequency, vaginal discharge Msk: Denies muscle cramps, joint pains Neuro: Denies weakness, numbness, tingling Skin: Denies bruising  +pruritic rash Psych: Denies depression, anxiety, hallucinations     Objective:    Blood pressure (!) 146/78, pulse 76, temperature 98.3 F (36.8 C), temperature source Oral, weight 178 lb 12.8 oz (81.1 kg), SpO2 95 %.   Gen. Pleasant, well-nourished, in no distress, normal affect HEENT: [place]/AT, face symmetric, conjunctiva clear, no scleral icterus,  PERRLA, EOMI, nares patent without drainage, pharynx without erythema or exudate.  Normal external ears.  Canals occluded with cerumen bilaterally.  B/l ears irrigated.  No cervical lymphadenopathy noted. Lungs: no accessory muscle use, CTAB, no wheezes or rales Cardiovascular: RRR, no m/r/g, no peripheral edema Musculoskeletal: No deformities, no cyanosis or clubbing, normal tone Neuro:  A&Ox3, CN II-XII intact, normal gait Skin:  Warm, dry, intact.  Faint erythematous macules and papules noted on neck, chest, back, abdomen, and bilateral UEs.   Wt Readings from Last 3 Encounters:  09/09/20 178 lb 12.8 oz (81.1 kg)  04/04/20 180 lb (81.6 kg)  01/22/20 192 lb (87.1 kg)    Lab Results  Component Value Date   WBC 4.3 09/04/2019   HGB 12.9 09/04/2019   HCT 38.9 09/04/2019   PLT 219.0 09/04/2019   GLUCOSE 89 09/04/2019   CHOL 168 09/04/2019   TRIG 100.0 09/04/2019   HDL 47.30 09/04/2019   LDLCALC 101 (H) 09/04/2019   ALT 12 07/04/2017   AST 13 07/04/2017   NA 140 09/04/2019   K 4.3 09/04/2019   CL 109 09/04/2019   CREATININE 0.94 09/04/2019   BUN 16 09/04/2019   CO2 23 09/04/2019   TSH 0.43 07/04/2017   HGBA1C 5.8 09/04/2019    Assessment/Plan:  Bilateral impacted cerumen -consent obtained.  B/l ears irrigated.  Pt tolerated procedure well. -consider debrox ear gtts.  Pain, dental -continue clindamycin  Dental abscess -continue clindamycin -f/u with Dentist for tooth extraction  Allergic reaction to drug, initial encounter -d/c amoxicillin -would avoid in the future -consider skin  test -OTC antihistamine advised to help with pruritis -ok to continue cortisone cream prn  F/u prn  Abbe Amsterdam, MD

## 2020-09-09 NOTE — Patient Instructions (Addendum)
You can continue using cortisone cream as needed.  You can also take a Claritin or Zyrtec to help with the itching.  Drug Rash A drug rash occurs when a medicine causes a change in the color or texture of the skin. It can develop minutes, hours, or days after you take the medicine. The rash may appear on a small area of skin or all over your body. What are the causes? This condition is usually caused by your body's reaction (allergy) to a medicine. It can also be caused by exposure to sunlight after taking a medicine that makes your skin sensitive to light. Though any medicine can cause a rash or reaction, medicines that are more likely to cause rashes include:  Penicillin.  Antibiotic medicines.  Medicines that treat seizures.  Medicines that treat cancer (chemotherapy).  Aspirin and other NSAIDs.  Injectable dyes that contain iodine.  Insulin. What are the signs or symptoms? Symptoms of this condition include:  Redness.  Tiny bumps.  Peeling.  Itching.  Itchy welts (hives).  Swelling.   How is this diagnosed? This condition may be diagnosed based on:  A physical exam.  Tests to find out which medicine caused the rash. These tests may include: ? Skin tests. ? Blood tests. ? Challenge test. For this test, you stop taking all the medicines that you do not need to take. Then, you start taking them again by adding back one medicine at a time. How is this treated? This condition is treated with medicines, including:  Antihistamine. This may be given to relieve itching.  NSAIDs. These may be given to reduce swelling and to treat pain.  A steroid medicine. This may be given to reduce swelling. The rash usually goes away when you stop taking the medicine that caused it. Follow these instructions at home:  Take over-the-counter and prescription medicines only as told by your health care provider.  Tell all your health care providers about any medicine reactions that  you have had in the past.  If your rash was caused by sensitivity to sunlight, and while your rash is healing: ? Avoid being in the sun if possible, especially when it is strongest, usually between 10 a.m. and 4 p.m. ? Cover your skin with pants, long sleeves, and a hat when you are exposed to sunlight.  If you have hives: ? Take a cool shower or use a cool compress to relieve itchiness. ? Take over-the-counter antihistamines, as recommended by your health care provider, until the hives are gone. Hives are not contagious.  Keep all follow-up visits as told by your health care provider. This is important. Contact a health care provider if you have:  A fever.  A rash that is not going away.  A rash that gets worse.  A rash that comes back.  Wheezing or coughing. Get help right away if:  You start to have breathing problems.  You start to have shortness of breath.  Your face or throat starts to swell.  You have severe weakness with dizziness or fainting.  You have chest pain. These symptoms may represent a serious problem that is an emergency. Do not wait to see if the symptoms will go away. Get medical help right away. Call your local emergency services (911 in the [place] Do not drive yourself to the hospital. Summary  A drug rash occurs when a medicine causes a change in the color or texture of the skin. The rash may appear on a small area  of skin or all over your body.  It can develop minutes, hours, or days after you take the medicine.  Your health care provider will do various tests to determine what medicine caused your rash.  The rash may be treated with medicine to relieve itching, swelling, and pain. This information is not intended to replace advice given to you by your health care provider. Make sure you discuss any questions you have with your health care provider. Document Revised: 09/04/2019 Document Reviewed: 09/04/2019 Elsevier Patient Education  2021  Elsevier Inc.  Dental Abscess  A dental abscess is an area of pus in or around a tooth. It comes from an infection. It can cause pain and other symptoms. Treatment will help with symptoms and prevent the infection from spreading. Follow these instructions at home: Medicines  Take over-the-counter and prescription medicines only as told by your dentist.  If you were prescribed an antibiotic medicine, take it as told by your dentist. Do not stop taking it even if you start to feel better.  If you were prescribed a gel that has numbing medicine in it, use it exactly as told.  Do not drive or use heavy machinery (like a Surveyor, mining) while taking prescription pain medicine. General instructions  Rinse out your mouth often with salt water. ? To make salt water, dissolve -1 tsp of salt in 1 cup of warm water.  Eat a soft diet while your mouth is healing.  Drink enough fluid to keep your urine pale yellow.  Do not apply heat to the outside of your mouth.  Do not use any products that contain nicotine or tobacco. These include cigarettes and e-cigarettes. If you need help quitting, ask your doctor.  Keep all follow-up visits as told by your dentist. This is important.   Prevent an abscess  Brush your teeth every morning and every night. Use fluoride toothpaste.  Floss your teeth each day.  Get dental cleanings as often as told by your dentist.  Think about getting dental sealant put on teeth that have deep holes (decay).  Drink water that has fluoride in it. ? Most tap water has fluoride. ? Check the label on bottled water to see if it has fluoride in it.  Drink water instead of sugary drinks.  Eat healthy meals and snacks.  Wear a mouth guard or face shield when you play sports. Contact a doctor if:  Your pain is worse, and medicine does not help. Get help right away if:  You have a fever or chills.  Your symptoms suddenly get worse.  You have a very bad  headache.  You have problems breathing or swallowing.  You have trouble opening your mouth.  You have swelling in your neck or close to your eye. Summary  A dental abscess is an area of pus in or around a tooth. It is caused by an infection.  Treatment will help with symptoms and prevent the infection from spreading.  Take over-the-counter and prescription medicines only as told by your dentist.  To prevent an abscess, take good care of your teeth. Brush your teeth every morning and night. Use floss every day.  Get dental cleanings as often as told by your dentist. This information is not intended to replace advice given to you by your health care provider. Make sure you discuss any questions you have with your health care provider. Document Revised: 12/13/2019 Document Reviewed: 12/13/2019 Elsevier Patient Education  2021 [employer].

## 2020-10-02 ENCOUNTER — Other Ambulatory Visit: Payer: Self-pay | Admitting: Family Medicine

## 2020-10-06 ENCOUNTER — Other Ambulatory Visit: Payer: Self-pay | Admitting: Family Medicine

## 2020-11-02 ENCOUNTER — Other Ambulatory Visit: Payer: Self-pay | Admitting: Family Medicine

## 2020-11-05 ENCOUNTER — Encounter: Payer: Self-pay | Admitting: Family Medicine

## 2020-11-09 ENCOUNTER — Other Ambulatory Visit: Payer: Self-pay | Admitting: Family Medicine

## 2020-11-11 ENCOUNTER — Other Ambulatory Visit: Payer: Self-pay | Admitting: Family Medicine

## 2020-11-11 DIAGNOSIS — G43709 Chronic migraine without aura, not intractable, without status migrainosus: Secondary | ICD-10-CM

## 2020-11-16 ENCOUNTER — Other Ambulatory Visit: Payer: Self-pay | Admitting: Family Medicine

## 2020-11-30 ENCOUNTER — Other Ambulatory Visit: Payer: Self-pay | Admitting: Family Medicine

## 2020-11-30 DIAGNOSIS — G43709 Chronic migraine without aura, not intractable, without status migrainosus: Secondary | ICD-10-CM

## 2020-12-18 ENCOUNTER — Encounter: Payer: Self-pay | Admitting: Family Medicine

## 2020-12-23 ENCOUNTER — Other Ambulatory Visit: Payer: Self-pay

## 2020-12-24 ENCOUNTER — Ambulatory Visit (INDEPENDENT_AMBULATORY_CARE_PROVIDER_SITE_OTHER): Payer: Commercial Managed Care - PPO | Admitting: Family Medicine

## 2020-12-24 ENCOUNTER — Encounter: Payer: Self-pay | Admitting: Family Medicine

## 2020-12-24 VITALS — BP 116/72 | HR 62 | Temp 98.3°F | Ht 65.0 in | Wt 173.6 lb

## 2020-12-24 DIAGNOSIS — M1711 Unilateral primary osteoarthritis, right knee: Secondary | ICD-10-CM | POA: Diagnosis not present

## 2020-12-24 DIAGNOSIS — Z23 Encounter for immunization: Secondary | ICD-10-CM

## 2020-12-24 DIAGNOSIS — E7841 Elevated Lipoprotein(a): Secondary | ICD-10-CM

## 2020-12-24 DIAGNOSIS — Z0000 Encounter for general adult medical examination without abnormal findings: Secondary | ICD-10-CM | POA: Diagnosis not present

## 2020-12-24 LAB — CBC WITH DIFFERENTIAL/PLATELET
Basophils Absolute: 0 10*3/uL (ref 0.0–0.1)
Basophils Relative: 0.7 % (ref 0.0–3.0)
Eosinophils Absolute: 0.1 10*3/uL (ref 0.0–0.7)
Eosinophils Relative: 2.8 % (ref 0.0–5.0)
HCT: 38.8 % (ref 36.0–46.0)
Hemoglobin: 12.8 g/dL (ref 12.0–15.0)
Lymphocytes Relative: 52.4 % — ABNORMAL HIGH (ref 12.0–46.0)
Lymphs Abs: 1.8 10*3/uL (ref 0.7–4.0)
MCHC: 33.1 g/dL (ref 30.0–36.0)
MCV: 90.4 fl (ref 78.0–100.0)
Monocytes Absolute: 0.3 10*3/uL (ref 0.1–1.0)
Monocytes Relative: 8.7 % (ref 3.0–12.0)
Neutro Abs: 1.2 10*3/uL — ABNORMAL LOW (ref 1.4–7.7)
Neutrophils Relative %: 35.4 % — ABNORMAL LOW (ref 43.0–77.0)
Platelets: 235 10*3/uL (ref 150.0–400.0)
RBC: 4.29 Mil/uL (ref 3.87–5.11)
RDW: 13.4 % (ref 11.5–15.5)
WBC: 3.4 10*3/uL — ABNORMAL LOW (ref 4.0–10.5)

## 2020-12-24 LAB — LIPID PANEL
Cholesterol: 190 mg/dL (ref 0–200)
HDL: 50.9 mg/dL (ref >39.00)
LDL Cholesterol: 118 mg/dL — ABNORMAL HIGH (ref 0–99)
NonHDL: 139.05
Total CHOL/HDL Ratio: 4
Triglycerides: 107 mg/dL (ref 0.0–149.0)
VLDL: 21.4 mg/dL (ref 0.0–40.0)

## 2020-12-24 LAB — BASIC METABOLIC PANEL
BUN: 16 mg/dL (ref 6–23)
CO2: 24 mEq/L (ref 19–32)
Calcium: 9.4 mg/dL (ref 8.4–10.5)
Chloride: 108 mEq/L (ref 96–112)
Creatinine, Ser: 0.91 mg/dL (ref 0.40–1.20)
GFR: 68.6 mL/min (ref >60.00)
Glucose, Bld: 87 mg/dL (ref 70–99)
Potassium: 4.1 mEq/L (ref 3.5–5.1)
Sodium: 141 mEq/L (ref 135–145)

## 2020-12-24 LAB — T4, FREE: Free T4: 0.73 ng/dL (ref 0.60–1.60)

## 2020-12-24 LAB — TSH: TSH: 1.03 u[IU]/mL (ref 0.35–5.50)

## 2020-12-24 LAB — HEMOGLOBIN A1C: Hgb A1c MFr Bld: 5.7 % (ref 4.6–6.5)

## 2020-12-24 NOTE — Progress Notes (Signed)
Subjective:     Evelyn Edwards is a 60 y.o. female and is here for a comprehensive physical exam. The patient reports R knee pain.  Note when standing while working at [employer].  Wearing comfortable, supportive shoes.  Tried ice and wearing knee brace.  Patient has a history of arthroscopic surgery on the right knee.  Advised had bone-on-bone.  Patient denies swelling or erythema.  Otherwise doing well.  Needs to schedule mammogram.  Colonoscopy up-to-date.  Social History   Socioeconomic History   Marital status: Single    Spouse name: Not on file   Number of children: Not on file   Years of education: Not on file   Highest education level: Not on file  Occupational History   Not on file  Tobacco Use   Smoking status: Never   Smokeless tobacco: Never  Substance and Sexual Activity   Alcohol use: No   Drug use: No   Sexual activity: Not on file  Other Topics Concern   Not on file  Social History Narrative   Not on file   Social Determinants of Health   Financial Resource Strain: Not on file  Food Insecurity: Not on file  Transportation Needs: Not on file  Physical Activity: Not on file  Stress: Not on file  Social Connections: Not on file  Intimate Partner Violence: Not on file   Health Maintenance  Topic Date Due   HIV Screening  Never done   Zoster Vaccines- Shingrix (1 of 2) Never done   MAMMOGRAM  12/13/2020   COVID-19 Vaccine (1) 01/09/2021 (Originally 07/15/1965)   INFLUENZA VACCINE  01/04/2021   COLONOSCOPY (Pts 45-26yrs Insurance coverage will need to be confirmed)  06/27/2023   TETANUS/TDAP  09/03/2029   Hepatitis C Screening  Completed   Pneumococcal Vaccine 43-23 Years old  Aged Out   HPV VACCINES  Aged Out   PAP SMEAR-Modifier  Discontinued    The following portions of the patient's history were reviewed and updated as appropriate: allergies, current medications, past family history, past medical history, past social history, past surgical history,  and problem list.  Review of Systems Pertinent items noted in HPI and remainder of comprehensive ROS otherwise negative.   Objective:    BP 116/72 (BP Location: Left Arm, Patient Position: Sitting, Cuff Size: Normal)   Pulse 62   Temp 98.3 F (36.8 C) (Oral)   Ht 5\' 5"  (1.651 m)   Wt 173 lb 9.6 oz (78.7 kg)   SpO2 99%   BMI 28.89 kg/m  General appearance: alert, cooperative, and no distress Head: Normocephalic, without obvious abnormality, atraumatic Eyes: conjunctivae/corneas clear. PERRL, EOM's intact. Fundi benign. Ears: normal TM's and external ear canals both ears Nose: Nares normal. Septum midline. Mucosa normal. No drainage or sinus tenderness. Throat: lips, mucosa, and tongue normal; teeth and gums normal Neck: no adenopathy, no carotid bruit, no JVD, supple, symmetrical, trachea midline, and thyroid not enlarged, symmetric, no tenderness/mass/nodules Lungs: clear to auscultation bilaterally Heart: regular rate and rhythm, S1, S2 normal, no murmur, click, rub or gallop Abdomen: soft, non-tender; bowel sounds normal; no masses,  no organomegaly Extremities: extremities normal, atraumatic, no cyanosis or edema Pulses: 2+ and symmetric Skin: Skin color, texture, turgor normal. No rashes or lesions Lymph nodes: Cervical, supraclavicular, and axillary nodes normal. Neurologic: Alert and oriented X 3, normal strength and tone. Normal symmetric reflexes. Normal coordination and gait    Assessment:    Healthy female exam.      Plan:  Anticipatory guidance given including wearing seatbelts, smoke detectors in the home, increasing physical activity, increasing p.o. intake of water and vegetables. -obtain labs -pt to schedule mammogram -colonoscopy up to date, done 06/26/2013, due 2025 -Given handout -Next CPE in 1 year See After Visit Summary for Counseling Recommendations   Arthritis of right knee -s/p arthroscopic surgery -Mild patellofemoral degenerative changes  noted on x-ray from 04/09/2019 -supportive care including ice, heat, topical analgesics, compression, elevation, rest - Plan: CBC with Differential/Platelet  Elevated lipoprotein(a) -Lifestyle modifications - Plan: Lipid panel  Need for shingles vaccine -Given this visit  F/u prn  Abbe Amsterdam, MD

## 2020-12-24 NOTE — Addendum Note (Signed)
Addended by: Elwin Mocha on: 12/24/2020 09:27 AM   Modules accepted: Orders

## 2020-12-24 NOTE — Patient Instructions (Addendum)
Do not forget to schedule your appointment for mammogram.

## 2021-01-11 ENCOUNTER — Other Ambulatory Visit: Payer: Self-pay | Admitting: Family Medicine

## 2021-01-25 ENCOUNTER — Ambulatory Visit: Payer: Commercial Managed Care - PPO | Admitting: Family Medicine

## 2021-02-04 ENCOUNTER — Other Ambulatory Visit: Payer: Self-pay | Admitting: Neurology

## 2021-02-04 MED ORDER — NURTEC 75 MG PO TBDP: 75.0000 mg | ORAL_TABLET | Freq: Every day | ORAL | 0 refills | Status: DC | PRN

## 2021-02-17 ENCOUNTER — Other Ambulatory Visit: Payer: Self-pay | Admitting: Family Medicine

## 2021-04-15 ENCOUNTER — Ambulatory Visit: Payer: Commercial Managed Care - PPO | Admitting: Family Medicine

## 2021-04-15 ENCOUNTER — Encounter: Payer: Self-pay | Admitting: Family Medicine

## 2021-04-15 VITALS — BP 126/76 | HR 65 | Temp 98.6°F | Wt 162.2 lb

## 2021-04-15 DIAGNOSIS — K641 Second degree hemorrhoids: Secondary | ICD-10-CM | POA: Diagnosis not present

## 2021-04-15 DIAGNOSIS — R634 Abnormal weight loss: Secondary | ICD-10-CM | POA: Diagnosis not present

## 2021-04-15 NOTE — Progress Notes (Signed)
Subjective:    Patient ID: Evelyn Edwards, female    DOB: 09/28/1960, 60 y.o.   MRN: [id number]  Chief Complaint  Patient presents with   Rectal Bleeding    Blood on toilet paper when wiped, noticed it the one time. Could not tell if it was in stool.    HPI Patient was seen today for acute concern.  Pt with blood on toilet paper 2 days ago.  Unsure if any blood in toilet or on stools.  Denies constipation, hemorrhoids, straining, emesis.  Had an episode of nausea last wk which causes her to stay home from work.  Resolved with zofran.  Taking ibuprofen with food for headaches and joint pain.  Pt endorses weight loss.  Was 173 lbs on 12/24/20, now 162.2 lbs. last colonoscopy 06/26/2013 with internal hemorrhoids.  Repeat colonoscopy due in 10 years, 2025.  Past Medical History:  Diagnosis Date   Anxiety    Headache(784.0)     Allergies  Allergen Reactions   Amoxicillin Hives and Other (See Comments)    Dizziness, fever tmax 101 F, erythema of lips and tongue, hives, sore throat, chills.    ROS General: Denies fever, chills, night sweats, changes in weight, changes in appetite +weight loss HEENT: Denies headaches, ear pain, changes in vision, rhinorrhea, sore throat CV: Denies CP, palpitations, SOB, orthopnea Pulm: Denies SOB, cough, wheezing GI: Denies abdominal pain, nausea, vomiting, diarrhea, constipation  + blood on toilet paper. GU: Denies dysuria, hematuria, frequency, vaginal discharge Msk: Denies muscle cramps, joint pains Neuro: Denies weakness, numbness, tingling Skin: Denies rashes, bruising Psych: Denies depression, anxiety, hallucinations    Objective:    Blood pressure 126/76, pulse 65, temperature 98.6 F (37 C), temperature source Oral, weight 162 lb 3.2 oz (73.6 kg), SpO2 97 %.  Gen. Pleasant, well-nourished, in no distress, normal affect   HEENT: [place]/AT, face symmetric, conjunctiva clear, no scleral icterus, PERRLA, EOMI, nares patent without drainage Lungs:  no accessory muscle use, CTAB, no wheezes or rales GU: normal appearing external female genitalia.  Small external hemorrhoid noted.  Normal rectal tone.  Small internal hemorrhoid appreciated, rectal vault otherwise smooth without stool present.  Stool guaiac negative. Cardiovascular: RRR, no m/r/g, no peripheral edema Musculoskeletal: No deformities, no cyanosis or clubbing, normal tone Neuro:  A&Ox3, CN II-XII intact, normal gait Skin:  Warm, no lesions/ rash  Wt Readings from Last 3 Encounters:  04/15/21 162 lb 3.2 oz (73.6 kg)  12/24/20 173 lb 9.6 oz (78.7 kg)  09/09/20 178 lb 12.8 oz (81.1 kg)    Lab Results  Component Value Date   WBC 3.4 (L) 12/24/2020   HGB 12.8 12/24/2020   HCT 38.8 12/24/2020   PLT 235.0 12/24/2020   GLUCOSE 87 12/24/2020   CHOL 190 12/24/2020   TRIG 107.0 12/24/2020   HDL 50.90 12/24/2020   LDLCALC 118 (H) 12/24/2020   ALT 12 07/04/2017   AST 13 07/04/2017   NA 141 12/24/2020   K 4.1 12/24/2020   CL 108 12/24/2020   CREATININE 0.91 12/24/2020   BUN 16 12/24/2020   CO2 24 12/24/2020   TSH 1.03 12/24/2020   HGBA1C 5.7 12/24/2020    Assessment/Plan:  Grade II hemorrhoids -asymptomatic -not currently bleeding.  Guaiac negative. -Discussed OTC hemorrhoid cream such as Preparation H or Tucks -Continue increasing p.o. intake of water, vegetables, and fiber to prevent straining -For continued symptoms place referral to GI   Weight loss -11 lbs weight loss since 12/24/20 -consider food diary -advised  to stay up to date on screenings such as mammogram.  Colonoscopy due 2025.  Would move up for continued wt change.  F/u prn in 1 month for continued weight changes.  Abbe Amsterdam, MD

## 2021-04-15 NOTE — Patient Instructions (Signed)
You can use over-the-counter Preparation H or Tucks cream/pads for any discomfort, itching, burning.  For continued or worsening symptoms please notify the clinic as a prescription suppository may be needed.

## 2021-05-17 ENCOUNTER — Other Ambulatory Visit: Payer: Self-pay | Admitting: Family Medicine

## 2021-06-17 ENCOUNTER — Other Ambulatory Visit: Payer: Self-pay | Admitting: Family Medicine

## 2021-07-25 ENCOUNTER — Other Ambulatory Visit: Payer: Self-pay | Admitting: Family Medicine

## 2021-07-25 DIAGNOSIS — G43709 Chronic migraine without aura, not intractable, without status migrainosus: Secondary | ICD-10-CM

## 2021-08-05 ENCOUNTER — Other Ambulatory Visit: Payer: Self-pay | Admitting: Family Medicine

## 2021-10-25 ENCOUNTER — Encounter: Payer: Self-pay | Admitting: Internal Medicine

## 2021-10-25 ENCOUNTER — Ambulatory Visit: Payer: Commercial Managed Care - PPO | Admitting: Internal Medicine

## 2021-10-25 VITALS — BP 140/90 | HR 78 | Temp 98.3°F | Ht 65.0 in | Wt 155.7 lb

## 2021-10-25 DIAGNOSIS — J069 Acute upper respiratory infection, unspecified: Secondary | ICD-10-CM

## 2021-10-25 DIAGNOSIS — R059 Cough, unspecified: Secondary | ICD-10-CM

## 2021-10-25 DIAGNOSIS — J029 Acute pharyngitis, unspecified: Secondary | ICD-10-CM | POA: Diagnosis not present

## 2021-10-25 LAB — POCT RAPID STREP A (OFFICE): Rapid Strep A Screen: NEGATIVE

## 2021-10-25 LAB — POC COVID19 BINAXNOW: SARS Coronavirus 2 Ag: NEGATIVE

## 2021-10-25 LAB — POCT INFLUENZA A/B
Influenza A, POC: NEGATIVE
Influenza B, POC: NEGATIVE

## 2021-10-25 NOTE — Progress Notes (Signed)
Acute office Visit     CC/Reason for Visit: URI symptoms  HPI: Evelyn Edwards is a 61 y.o. female who is coming in today for the above mentioned reasons.  3 days ago she started Fruehling.  Cough, headache, chills, maximum temperature was 99.  She has had no sick contacts or recent travel.  She has been taking over-the-counter cold preparations without much relief.  Past Medical/Surgical History: Past Medical History:  Diagnosis Date   Anxiety    Headache(784.0)     Past Surgical History:  Procedure Laterality Date   ABDOMINAL HYSTERECTOMY     ovaries were left intact    Social History:  reports that she has never smoked. She has never used smokeless tobacco. She reports that she does not drink alcohol and does not use drugs.  Allergies: Allergies  Allergen Reactions   Amoxicillin Hives and Other (See Comments)    Dizziness, fever tmax 101 F, erythema of lips and tongue, hives, sore throat, chills.    Family History:  Family History  Problem Relation Age of Onset   Hypertension Father    Diabetes Father    Coronary artery disease Father    Colon polyps Father    Asthma Mother    Lupus Sister    Migraines Sister    Anxiety disorder Other      Current Outpatient Medications:    ondansetron (ZOFRAN-ODT) 4 MG disintegrating tablet, DISSOLVE 1 TABLET(4 MG) ON THE TONGUE EVERY 8 HOURS AS NEEDED FOR NAUSEA OR VOMITING, Disp: 20 tablet, Rfl: 2   topiramate (TOPAMAX) 100 MG tablet, TAKE 1 TABLET BY MOUTH TWICE DAILY, Disp: 180 tablet, Rfl: 1   traMADol (ULTRAM) 50 MG tablet, TAKE 1 TABLET(50 MG) BY MOUTH EVERY 8 HOURS AS NEEDED, Disp: 60 tablet, Rfl: 1   methocarbamol (ROBAXIN) 500 MG tablet, Take 1 tablet (500 mg total) by mouth 3 (three) times daily as needed for muscle spasms. (Patient not taking: Reported on 10/25/2021), Disp: 20 tablet, Rfl: 0   naratriptan (AMERGE) 2.5 MG tablet, Take 1 tablet (2.5 mg total) by mouth as needed for migraine. Take one (1)  tablet at onset of headache; if returns or does not resolve, may repeat after 2 hours; do not exceed five (5) mg in 24 hours. (Patient not taking: Reported on 10/25/2021), Disp: 9 tablet, Rfl: 11   Rimegepant Sulfate (NURTEC) 75 MG TBDP, Take 75 mg by mouth daily as needed. For migraines. Take as close to onset of migraine as possible. One daily maximum. (Patient not taking: Reported on 10/25/2021), Disp: 10 tablet, Rfl: 0   triamcinolone cream (KENALOG) 0.1 %, Apply 1 application topically 2 (two) times daily. (Patient not taking: Reported on 10/25/2021), Disp: 30 g, Rfl: 2  Review of Systems:  Constitutional: Positive for chills, diaphoresis, appetite change and fatigue.  HEENT: Denies photophobia, eye pain, redness, hearing loss, mouth sores, trouble swallowing, neck pain, neck stiffness and tinnitus.   Respiratory: Denies SOB, DOE,  chest tightness,  and wheezing.   Cardiovascular: Denies chest pain, palpitations and leg swelling.  Gastrointestinal: Denies nausea, vomiting, abdominal pain, diarrhea, constipation, blood in stool and abdominal distention.  Genitourinary: Denies dysuria, urgency, frequency, hematuria, flank pain and difficulty urinating.  Endocrine: Denies: hot or cold intolerance, sweats, changes in hair or nails, polyuria, polydipsia. Musculoskeletal: Denies myalgias, back pain, joint swelling, arthralgias and gait problem.  Skin: Denies pallor, rash and wound.  Neurological: Denies dizziness, seizures, syncope, weakness, light-headedness, numbness and headaches.  Hematological: Denies  adenopathy. Easy bruising, personal or family bleeding history  Psychiatric/Behavioral: Denies suicidal ideation, mood changes, confusion, nervousness, sleep disturbance and agitation    Physical Exam: Vitals:   10/25/21 1534 10/25/21 1536  BP: (!) 142/78 140/90  Pulse: 78   Temp: 98.3 F (36.8 C)   TempSrc: Oral   SpO2: 97%   Weight: 155 lb 11.2 oz (70.6 kg)   Height: 5\' 5"  (1.651 m)      Body mass index is 25.91 kg/m.   Constitutional: NAD, calm, comfortable Eyes: PERRL, lids and conjunctivae normal, wears corrective lenses ENMT: Mucous membranes are moist. Posterior pharynx is erythematous without any exudate or lesions. Normal dentition. Tympanic membrane is pearly white, no erythema or bulging. Respiratory: clear to auscultation bilaterally, no wheezing, no crackles. Normal respiratory effort. No accessory muscle use.  Cardiovascular: Regular rate and rhythm, no murmurs / rubs / gallops. No extremity edema.  Psychiatric: Normal judgment and insight. Alert and oriented x 3. Normal mood.    Impression and Plan:  Cough, unspecified type - Plan: POC COVID-19, POC Influenza A/B  Sore throat - Plan: POC Rapid Strep A  Viral upper respiratory tract infection  -COVID/flu/strep tests are negative in office. -Likely simple viral URI. --Given exam findings, PNA, pharyngitis, ear infection are not likely, hence abx have not been prescribed. -Have advised rest, fluids, OTC antihistamines, cough suppressants and mucinex. -RTC if no improvement in 10-14 days.   Time spent: 25 minutes reviewing chart, interviewing and examining patient and formulating plan of care.      Lelon Frohlich, MD [facility]

## 2021-10-26 ENCOUNTER — Telehealth: Payer: Self-pay | Admitting: *Deleted

## 2021-10-26 NOTE — Telephone Encounter (Signed)
Patient is aware 

## 2021-10-26 NOTE — Telephone Encounter (Signed)
Patient is calling complaining of worsening cough and headache. Patient states she had tried OTC antihistamines, cough suppressants and mucinex with little or no relief.  Patient was seen 10/25/21.  Patient did not go to work today due to coughing.  Please advise.

## 2021-11-04 ENCOUNTER — Other Ambulatory Visit: Payer: Self-pay | Admitting: Family Medicine

## 2021-11-29 ENCOUNTER — Other Ambulatory Visit: Payer: Self-pay | Admitting: Family Medicine

## 2021-11-29 DIAGNOSIS — Z1231 Encounter for screening mammogram for malignant neoplasm of breast: Secondary | ICD-10-CM

## 2021-12-09 ENCOUNTER — Ambulatory Visit
Admission: RE | Admit: 2021-12-09 | Discharge: 2021-12-09 | Disposition: A | Discharge status: Home / Self Care | Payer: Commercial Managed Care - PPO | Source: Ambulatory Visit | Attending: Family Medicine | Admitting: Family Medicine

## 2021-12-09 ENCOUNTER — Other Ambulatory Visit: Payer: Self-pay | Admitting: Family Medicine

## 2021-12-09 DIAGNOSIS — Z1231 Encounter for screening mammogram for malignant neoplasm of breast: Secondary | ICD-10-CM

## 2021-12-18 ENCOUNTER — Other Ambulatory Visit: Payer: Self-pay | Admitting: Family Medicine

## 2021-12-18 DIAGNOSIS — G43709 Chronic migraine without aura, not intractable, without status migrainosus: Secondary | ICD-10-CM

## 2021-12-27 ENCOUNTER — Encounter: Payer: Self-pay | Admitting: Family Medicine

## 2021-12-27 ENCOUNTER — Ambulatory Visit (INDEPENDENT_AMBULATORY_CARE_PROVIDER_SITE_OTHER): Payer: Commercial Managed Care - PPO | Admitting: Family Medicine

## 2021-12-27 VITALS — BP 132/74 | HR 66 | Temp 98.1°F | Ht 65.75 in | Wt 153.8 lb

## 2021-12-27 DIAGNOSIS — Z0000 Encounter for general adult medical examination without abnormal findings: Secondary | ICD-10-CM

## 2021-12-27 DIAGNOSIS — Z23 Encounter for immunization: Secondary | ICD-10-CM | POA: Diagnosis not present

## 2021-12-27 DIAGNOSIS — R002 Palpitations: Secondary | ICD-10-CM

## 2021-12-27 DIAGNOSIS — M79601 Pain in right arm: Secondary | ICD-10-CM

## 2021-12-27 DIAGNOSIS — K648 Other hemorrhoids: Secondary | ICD-10-CM

## 2021-12-27 DIAGNOSIS — R634 Abnormal weight loss: Secondary | ICD-10-CM

## 2021-12-27 DIAGNOSIS — G43009 Migraine without aura, not intractable, without status migrainosus: Secondary | ICD-10-CM | POA: Diagnosis not present

## 2021-12-27 LAB — CBC WITH DIFFERENTIAL/PLATELET
Basophils Absolute: 0 10*3/uL (ref 0.0–0.1)
Basophils Relative: 0.6 % (ref 0.0–3.0)
Eosinophils Absolute: 0.1 10*3/uL (ref 0.0–0.7)
Eosinophils Relative: 2.5 % (ref 0.0–5.0)
HCT: 39.4 % (ref 36.0–46.0)
Hemoglobin: 13.1 g/dL (ref 12.0–15.0)
Lymphocytes Relative: 48.7 % — ABNORMAL HIGH (ref 12.0–46.0)
Lymphs Abs: 1.8 10*3/uL (ref 0.7–4.0)
MCHC: 33.2 g/dL (ref 30.0–36.0)
MCV: 90.5 fl (ref 78.0–100.0)
Monocytes Absolute: 0.3 10*3/uL (ref 0.1–1.0)
Monocytes Relative: 8.5 % (ref 3.0–12.0)
Neutro Abs: 1.5 10*3/uL (ref 1.4–7.7)
Neutrophils Relative %: 39.7 % — ABNORMAL LOW (ref 43.0–77.0)
Platelets: 220 10*3/uL (ref 150.0–400.0)
RBC: 4.35 Mil/uL (ref 3.87–5.11)
RDW: 13.3 % (ref 11.5–15.5)
WBC: 3.7 10*3/uL — ABNORMAL LOW (ref 4.0–10.5)

## 2021-12-27 LAB — LIPID PANEL
Cholesterol: 202 mg/dL — ABNORMAL HIGH (ref 0–200)
HDL: 60.5 mg/dL (ref >39.00)
LDL Cholesterol: 111 mg/dL — ABNORMAL HIGH (ref 0–99)
NonHDL: 141.5
Total CHOL/HDL Ratio: 3
Triglycerides: 155 mg/dL — ABNORMAL HIGH (ref 0.0–149.0)
VLDL: 31 mg/dL (ref 0.0–40.0)

## 2021-12-27 LAB — COMPREHENSIVE METABOLIC PANEL
ALT: 12 U/L (ref 0–35)
AST: 16 U/L (ref 0–37)
Albumin: 4.3 g/dL (ref 3.5–5.2)
Alkaline Phosphatase: 62 U/L (ref 39–117)
BUN: 12 mg/dL (ref 6–23)
CO2: 26 mEq/L (ref 19–32)
Calcium: 9.6 mg/dL (ref 8.4–10.5)
Chloride: 108 mEq/L (ref 96–112)
Creatinine, Ser: 1.02 mg/dL (ref 0.40–1.20)
GFR: 59.4 mL/min — ABNORMAL LOW (ref >60.00)
Glucose, Bld: 93 mg/dL (ref 70–99)
Potassium: 4 mEq/L (ref 3.5–5.1)
Sodium: 141 mEq/L (ref 135–145)
Total Bilirubin: 0.3 mg/dL (ref 0.2–1.2)
Total Protein: 7.4 g/dL (ref 6.0–8.3)

## 2021-12-27 LAB — TSH: TSH: 0.59 u[IU]/mL (ref 0.35–5.50)

## 2021-12-27 LAB — HEMOGLOBIN A1C: Hgb A1c MFr Bld: 6 % (ref 4.6–6.5)

## 2021-12-27 LAB — T4, FREE: Free T4: 0.71 ng/dL (ref 0.60–1.60)

## 2021-12-27 NOTE — Progress Notes (Signed)
Subjective:     Evelyn Edwards is a 61 y.o. female and is here for a comprehensive physical exam. The patient reports R upper arm pain/pulling sensation with overhead movements.  Does not hurt all the time.  Patient denies joint pain and right shoulder, injury, numbness/tingling, overuse.  Pt endorses being bright red blood 2 weeks ago when wiped after having a BM.  Noticed blood on tissue and in toilet.  Patient did not call change in stools.  Denies constipation, has BMs 3 times per day.  Patient previously seen for history of internal hemorrhoids.  Last colonoscopy 06/26/2013.  Patient endorses changes in weight.  Weight previously 180s lbs, but now 153 LBS.  Patient endorses eating breakfast around 930 am then lunch/dinner at 4:30 PM.  Patient works in [employer] which keeps her active walking around during her shift.  Patient typically does not eat anything after work as it is already 9 PM.  Patient drinking plenty of water daily.  Endorses continued migraines, 3 times per week.  In the past took preventative medications and seen by Dr. Daisy Blossom, neurology.  Will take tramadol sparingly for headaches.  Patient endorses some family stress with her daughter and caring for her mother.  Has noted an occasional palpitation that does not last long.  Patient denies increased caffeine intake.  Drinking 2 cups of coffee in the morning.  Mammogram done 12/09/2021.  Patient inquires about second shingles vaccine.  Social History   Socioeconomic History   Marital status: Single    Spouse name: Not on file   Number of children: Not on file   Years of education: Not on file   Highest education level: Not on file  Occupational History   Not on file  Tobacco Use   Smoking status: Never   Smokeless tobacco: Never  Substance and Sexual Activity   Alcohol use: No   Drug use: No   Sexual activity: Not on file  Other Topics Concern   Not on file  Social History Narrative   Not on file   Social Determinants of  Health   Financial Resource Strain: Not on file  Food Insecurity: Not on file  Transportation Needs: Not on file  Physical Activity: Not on file  Stress: Not on file  Social Connections: Not on file  Intimate Partner Violence: Not on file   Health Maintenance  Topic Date Due   HIV Screening  Never done   COVID-19 Vaccine (1) 01/12/2022 (Originally 01/12/1961)   Zoster Vaccines- Shingrix (2 of 2) 01/25/2022 (Originally 02/18/2021)   INFLUENZA VACCINE  01/04/2022   COLONOSCOPY (Pts 45-54yrs Insurance coverage will need to be confirmed)  06/27/2023   MAMMOGRAM  12/10/2023   TETANUS/TDAP  09/03/2029   Hepatitis C Screening  Completed   HPV VACCINES  Aged Out   PAP SMEAR-Modifier  Discontinued    The following portions of the patient's history were reviewed and updated as appropriate: allergies, current medications, past family history, past medical history, past social history, past surgical history, and problem list.  Review of Systems Pertinent items noted in HPI and remainder of comprehensive ROS otherwise negative.   Objective:    BP 132/74 (BP Location: Right Arm, Patient Position: Sitting, Cuff Size: Normal)   Pulse 66   Temp 98.1 F (36.7 C) (Oral)   Ht 5' 5.75" (1.67 m)   Wt 153 lb 12.8 oz (69.8 kg)   SpO2 97%   BMI 25.01 kg/m  General appearance: alert, cooperative, and no distress  Head: Normocephalic, without obvious abnormality, atraumatic Eyes: conjunctivae/corneas clear. PERRL, EOM's intact. Fundi benign. Ears: normal TM's and external ear canals both ears Nose: Nares normal. Septum midline. Mucosa normal. No drainage or sinus tenderness. Throat: lips, mucosa, and tongue normal; teeth and gums normal Neck: no adenopathy, no carotid bruit, no JVD, supple, symmetrical, trachea midline, and thyroid not enlarged, symmetric, no tenderness/mass/nodules Lungs: clear to auscultation bilaterally Heart: regular rate and rhythm, S1, S2 normal, no murmur, click, rub or  gallop Abdomen: soft, non-tender; bowel sounds normal; no masses,  no organomegaly Extremities: extremities normal, atraumatic, no cyanosis or edema Pulses: 2+ and symmetric Skin: Skin color, texture, turgor normal. No rashes or lesions Lymph nodes: Cervical, supraclavicular, and axillary nodes normal. Neurologic: Alert and oriented X 3, normal strength and tone. Normal symmetric reflexes. Normal coordination and gait    Wt Readings from Last 3 Encounters:  12/27/21 153 lb 12.8 oz (69.8 kg)  10/25/21 155 lb 11.2 oz (70.6 kg)  04/15/21 162 lb 3.2 oz (73.6 kg)    Assessment:    Healthy female exam with R lateral upper arm pain, brbpr with h/o nternal hermorrhoids, and weight loss     Plan:    Anticipatory guidance given including wearing seatbelts, smoke detectors in the home, increasing physical activity, increasing p.o. intake of water and vegetables. -labs -mammogram done 12/09/21 -Colonoscopy done 06/26/2013 -Immunizations reviewed.  Second dose of shingles vaccine given this visit.  Consider pneumonia vaccine. -Bone density scan at age 24 -given handout -next CPE in 1 yr See After Visit Summary for Counseling Recommendations  - Plan: Hemoglobin A1c, Lipid panel, CMP  Right arm pain -Discussed muscle strain versus small lipoma pressing against muscle causing discomfort with certain movements. -Discussed stretching and supportive care including topical analgesics, heat, etc. -Patient to monitor for any growths is difficult to fully appreciate possible lipoma on exam. -For continued or worsening symptoms PT and consider imaging.  Internal hemorrhoid -Stable without bleeding -Refer to gastroenterology -Given precautions - Plan: CBC with Differential/Platelet, Ambulatory referral to Gastroenterology  Migraine without aura and without status migrainosus, not intractable -Previous PCP diagnosed patient with cluster headaches -Discussed headache prevention including hydration,  rest, decreasing caffeine intake -Previously seen by Dr. Lucia Gaskins, neurology.  Will place new referral. - Plan: Ambulatory referral to Neurology  Weight loss -Weight prepandemic 165 lbs in 2019.  Highest weight 182 lbs on 09/04/2019.  Weight today 153 LBS. -Discussed various possible causes including weight gain during pandemic, increased activity at work, thyroid dysfunction, changes in intake, GI issues. -Discussed obtaining labs -Continue to monitor -We will have patient follow-up in 1 month, sooner if needed for weight.  - Plan: CBC with Differential/Platelet, TSH, T4, Free, Hemoglobin A1c, Ambulatory referral to Gastroenterology  Need for shingles vaccine -First shingles vaccine given 12/24/2020 -Second dose of shingles vaccine given this visit  Palpitations  -Asymptomatic -Unable to appreciate on exam -Discussed Holter monitor for continued symptoms -Discussed decreasing caffeine intake -We will obtain labs to evaluate for thyroid dysfunction. - Plan: CBC with Differential/Platelet, TSH, T4, Free, Hemoglobin A1c, CMP  F/u in 1 month for wt check  Abbe Amsterdam, MD

## 2022-01-07 ENCOUNTER — Other Ambulatory Visit: Payer: Self-pay

## 2022-01-07 ENCOUNTER — Encounter: Payer: Self-pay | Admitting: Internal Medicine

## 2022-01-07 DIAGNOSIS — R899 Unspecified abnormal finding in specimens from other organs, systems and tissues: Secondary | ICD-10-CM

## 2022-01-27 ENCOUNTER — Ambulatory Visit: Payer: Commercial Managed Care - PPO | Admitting: Family Medicine

## 2022-01-27 VITALS — BP 142/88 | HR 76 | Temp 98.5°F | Wt 151.2 lb

## 2022-01-27 DIAGNOSIS — R634 Abnormal weight loss: Secondary | ICD-10-CM | POA: Diagnosis not present

## 2022-01-27 DIAGNOSIS — R195 Other fecal abnormalities: Secondary | ICD-10-CM | POA: Diagnosis not present

## 2022-01-27 NOTE — Progress Notes (Signed)
Subjective:    Patient ID: Evelyn Edwards, female    DOB: 1961-02-20, 61 y.o.   MRN: [id number]  Chief Complaint  Patient presents with   Follow-up    Weight loss.     HPI Patient is a 61 yo female with pmh sig for migraines, vertigo, low back pain who was seen today for f/u on weight.  Pt still having stomach issues.  Having episodes of abd cramping/pain then loose stools after eating.  Denies abd burning sensation/pain with eating.  Had nausea last wk and an episode of emesis.  Pt has not tried anything for symptoms.  Not able to see GI until 02/17/22, but on cancellation list. Still loosing wt., now 151.2 lbs, was 154 lbs at [facility] on 12/27/21.  Past Medical History:  Diagnosis Date   Anxiety    Headache(784.0)     Allergies  Allergen Reactions   Amoxicillin Hives and Other (See Comments)    Dizziness, fever tmax 101 F, erythema of lips and tongue, hives, sore throat, chills.    ROS  General: Denies fever, chills, night sweats, changes in appetite  +wt loss  HEENT: Denies headaches, ear pain, changes in vision, rhinorrhea, sore throat CV: Denies CP, palpitations, SOB, orthopnea Pulm: Denies SOB, cough, wheezing GI: Denies abdominal pain, nausea, vomiting, constipation +loose stools GU: Denies dysuria, hematuria, frequency, vaginal discharge Msk: Denies muscle cramps, joint pains Neuro: Denies weakness, numbness, tingling Skin: Denies rashes, bruising Psych: Denies depression, anxiety, hallucinations  Objective:    Blood pressure (!) 142/88, pulse 76, temperature 98.5 F (36.9 C), temperature source Oral, weight 151 lb 3.2 oz (68.6 kg), SpO2 99 %.  Gen. Pleasant, well-nourished, in no distress, normal affect   HEENT: [place]/AT, face symmetric, conjunctiva clear, no scleral icterus, PERRLA, EOMI, nares patent without drainage Lungs: no accessory muscle use, CTAB, no wheezes or rales Cardiovascular: RRR, no m/r/g, no peripheral edema Abdomen: BS present, soft, NT/ND, no  hepatosplenomegaly. Neuro:  A&Ox3, CN II-XII intact, normal gait Skin:  Warm, no lesions/ rash   Wt Readings from Last 3 Encounters:  01/27/22 151 lb 3.2 oz (68.6 kg)  12/27/21 153 lb 12.8 oz (69.8 kg)  10/25/21 155 lb 11.2 oz (70.6 kg)    Lab Results  Component Value Date   WBC 3.7 (L) 12/27/2021   HGB 13.1 12/27/2021   HCT 39.4 12/27/2021   PLT 220.0 12/27/2021   GLUCOSE 93 12/27/2021   CHOL 202 (H) 12/27/2021   TRIG 155.0 (H) 12/27/2021   HDL 60.50 12/27/2021   LDLCALC 111 (H) 12/27/2021   ALT 12 12/27/2021   AST 16 12/27/2021   NA 141 12/27/2021   K 4.0 12/27/2021   CL 108 12/27/2021   CREATININE 1.02 12/27/2021   BUN 12 12/27/2021   CO2 26 12/27/2021   TSH 0.59 12/27/2021   HGBA1C 6.0 12/27/2021    Assessment/Plan:  Loose stools  Weight loss  Concerns loose stools contributing to wt loss.  Was 182 pounds on 09/04/2019.  Current weight 151.2 pounds, down 3 pounds since last OFV on 12/27/2021.  Advised to use OTC imodium.  Consider Low FODMAP diet.  Encouraged to keep GI appt as EGD likely advised.  Given handouts and strict precautions.  F/u prn   Abbe Amsterdam, MD

## 2022-01-27 NOTE — Patient Instructions (Signed)
You can try over-the-counter Imodium AD for loose stools.  Use as directed on package.  You can also consider trying a probiotic to help with loose stools.  Probiotics can be found on the shelf at your local drugstore, they contain "good bacteria" like lactobacillus and acidophilus that help with your gut health.

## 2022-02-01 ENCOUNTER — Other Ambulatory Visit: Payer: Self-pay | Admitting: Family Medicine

## 2022-02-17 ENCOUNTER — Ambulatory Visit: Payer: Commercial Managed Care - PPO | Admitting: Internal Medicine

## 2022-02-17 ENCOUNTER — Encounter: Payer: Self-pay | Admitting: Internal Medicine

## 2022-02-17 VITALS — BP 126/78 | HR 69 | Ht 65.5 in | Wt 149.4 lb

## 2022-02-17 DIAGNOSIS — K625 Hemorrhage of anus and rectum: Secondary | ICD-10-CM

## 2022-02-17 MED ORDER — NA SULFATE-K SULFATE-MG SULF 17.5-3.13-1.6 GM/177ML PO SOLN: 1.0000 | Freq: Once | ORAL | 0 refills | Status: AC

## 2022-02-17 NOTE — Patient Instructions (Signed)
_______________________________________________________  If you are age 61 or older, your body mass index should be between 23-30. Your Body mass index is 24.48 kg/m. If this is out of the aforementioned range listed, please consider follow up with your Primary Care Provider.  If you are age 32 or younger, your body mass index should be between 19-25. Your Body mass index is 24.48 kg/m. If this is out of the aformentioned range listed, please consider follow up with your Primary Care Provider.   ________________________________________________________  The [place] [facility] providers would like to encourage you to use [facility] to communicate with providers for non-urgent requests or questions.  Due to long hold times on the telephone, sending your provider a message by [facility] may be a faster and more efficient way to get a response.  Please allow 48 business hours for a response.  Please remember that this is for non-urgent requests.  _______________________________________________________  Evelyn Edwards have been scheduled for a colonoscopy. Please follow written instructions given to you at your visit today.  Please pick up your prep supplies at the pharmacy within the next 1-3 days. If you use inhalers (even only as needed), please bring them with you on the day of your procedure.

## 2022-02-17 NOTE — Progress Notes (Signed)
HISTORY OF PRESENT ILLNESS:  Evelyn Edwards is a 61 y.o. female who presents today for rectal bleeding.  I have not seen the patient since January 2015.  See that dictation.  The patient reports to me that she had significant rectal bleeding for 2 consecutive days about 2 months ago.  It was associated minor rectal pain.  No recurrent problems since.  Bowel habits are slightly loose at that time.  Have resumed their normal form.  This concerns her.  Blood work from December 27, 2021 shows normal comprehensive metabolic panel.  Normal CBC with hemoglobin 13.1.  Last complete colonoscopy January 2015 was normal.  Internal hemorrhoids were demonstrated on retroflexed view.  GI review of systems is otherwise negative  REVIEW OF SYSTEMS:  All non-GI ROS negative unless other stated in the HPI except for headaches  Past Medical History:  Diagnosis Date   Anxiety    Headache(784.0)     Past Surgical History:  Procedure Laterality Date   ABDOMINAL HYSTERECTOMY  2003   ovaries were left intact   WRIST SURGERY      Social History Evelyn Edwards  reports that she has never smoked. She has never used smokeless tobacco. She reports that she does not drink alcohol and does not use drugs.  family history includes Anxiety disorder in an other family member; Asthma in her mother; Colon polyps in her father; Coronary artery disease in her father; Diabetes in her father; Hypertension in her father; Lupus in her sister; Migraines in her sister; Prostate cancer in her father.  Allergies  Allergen Reactions   Amoxicillin Hives and Other (See Comments)    Dizziness, fever tmax 101 F, erythema of lips and tongue, hives, sore throat, chills.       PHYSICAL EXAMINATION: Vital signs: BP 126/78   Pulse 69   Ht 5' 5.5" (1.664 m)   Wt 149 lb 6.4 oz (67.8 kg)   SpO2 95%   BMI 24.48 kg/m   Constitutional: generally well-appearing, no acute distress Psychiatric: alert and oriented x3,  cooperative Eyes: extraocular movements intact, anicteric, conjunctiva pink Mouth: oral pharynx moist, no lesions Neck: supple no lymphadenopathy Cardiovascular: heart regular rate and rhythm, no murmur Lungs: clear to auscultation bilaterally Abdomen: soft, nontender, nondistended, no obvious ascites, no peritoneal signs, normal bowel sounds, no organomegaly Rectal: Deferred to colonoscopy Extremities: no clubbing, cyanosis, or lower extremity edema bilaterally Skin: no lesions on visible extremities Neuro: No focal deficits.  Cranial nerves intact  ASSESSMENT:  1.  Rectal bleeding.  Based on history suspect transient fissure or hemorrhoids.  Rule out neoplasia.  Has been 9 years since her last colonoscopy 2.  Colonoscopy January 2015.  Normal   PLAN:  1.  Colonoscopy.The nature of the procedure, as well as the risks, benefits, and alternatives were carefully and thoroughly reviewed with the patient. Ample time for discussion and questions allowed. The patient understood, was satisfied, and agreed to proceed.

## 2022-02-21 ENCOUNTER — Telehealth: Payer: Self-pay | Admitting: Internal Medicine

## 2022-02-21 NOTE — Telephone Encounter (Signed)
Inbound call from patient stating that she went to the pharmacy to get her prep for her procedure on 9/27. Patient stated that her insurance will not cover  PLENVU and is requesting an alternative prep be sent. Please advise.

## 2022-02-23 NOTE — Telephone Encounter (Signed)
Lm on vm that I would put a Plenvu sample up front for her to pick up.

## 2022-03-01 ENCOUNTER — Ambulatory Visit (AMBULATORY_SURGERY_CENTER): Payer: Commercial Managed Care - PPO | Admitting: Internal Medicine

## 2022-03-01 ENCOUNTER — Encounter: Payer: Self-pay | Admitting: Internal Medicine

## 2022-03-01 VITALS — BP 115/49 | HR 55 | Temp 96.4°F | Resp 14 | Ht 65.0 in | Wt 149.0 lb

## 2022-03-01 DIAGNOSIS — K625 Hemorrhage of anus and rectum: Secondary | ICD-10-CM

## 2022-03-01 DIAGNOSIS — K5730 Diverticulosis of large intestine without perforation or abscess without bleeding: Secondary | ICD-10-CM

## 2022-03-01 DIAGNOSIS — K649 Unspecified hemorrhoids: Secondary | ICD-10-CM | POA: Diagnosis not present

## 2022-03-01 MED ORDER — SODIUM CHLORIDE 0.9 % IV SOLN: 500.0000 mL | Freq: Once | INTRAVENOUS | Status: DC

## 2022-03-01 NOTE — Patient Instructions (Signed)
   Handouts on diverticulosis & hemorrhoids given to you today    YOU HAD AN ENDOSCOPIC PROCEDURE TODAY AT THE [facility]:   Refer to the procedure report that was given to you for any specific questions about what was found during the examination.  If the procedure report does not answer your questions, please call your gastroenterologist to clarify.  If you requested that your care partner not be given the details of your procedure findings, then the procedure report has been included in a sealed envelope for you to review at your convenience later.  YOU SHOULD EXPECT: Some feelings of bloating in the abdomen. Passage of more gas than usual.  Walking can help get rid of the air that was put into your GI tract during the procedure and reduce the bloating. If you had a lower endoscopy (such as a colonoscopy or flexible sigmoidoscopy) you may notice spotting of blood in your stool or on the toilet paper. If you underwent a bowel prep for your procedure, you may not have a normal bowel movement for a few days.  Please Note:  You might notice some irritation and congestion in your nose or some drainage.  This is from the oxygen used during your procedure.  There is no need for concern and it should clear up in a day or so.  SYMPTOMS TO REPORT IMMEDIATELY:  Following lower endoscopy (colonoscopy or flexible sigmoidoscopy):  Excessive amounts of blood in the stool  Significant tenderness or worsening of abdominal pains  Swelling of the abdomen that is new, acute  Fever of 100F or higher   For urgent or emergent issues, a gastroenterologist can be reached at any hour by calling [phone]. Do not use MyChart messaging for urgent concerns.    DIET:  We do recommend a small meal at first, but then you may proceed to your regular diet.  Drink plenty of fluids but you should avoid alcoholic beverages for 24 hours.  ACTIVITY:  You should plan to take it easy for the rest of today  and you should NOT DRIVE or use heavy machinery until tomorrow (because of the sedation medicines used during the test).    FOLLOW UP: Our staff will call the number listed on your records the next business day following your procedure.  We will call around 7:15- 8:00 am to check on you and address any questions or concerns that you may have regarding the information given to you following your procedure. If we do not reach you, we will leave a message.     If any biopsies were taken you will be contacted by phone or by letter within the next 1-3 weeks.  Please call us at [phone] if you have not heard about the biopsies in 3 weeks.    SIGNATURES/CONFIDENTIALITY: You and/or your care partner have signed paperwork which will be entered into your electronic medical record.  These signatures attest to the fact that that the information above on your After Visit Summary has been reviewed and is understood.  Full responsibility of the confidentiality of this discharge information lies with you and/or your care-partner. 

## 2022-03-01 NOTE — Op Note (Signed)
[facility] Patient Name: Evelyn Edwards Procedure Date: 03/01/2022 11:40 AM MRN: [id number] Endoscopist: Wilhemina Bonito. Marina Goodell , MD Age: 61 Referring MD:  Date of Birth: June 09, 1960 Gender: Female Account #: [id number] Procedure:                Colonoscopy Indications:              Rectal bleeding. Previous examinations 2009 and                            2015 were normal except for hemorrhoids Medicines:                Monitored Anesthesia Care Procedure:                Pre-Anesthesia Assessment:                           - Prior to the procedure, a History and Physical                            was performed, and patient medications and                            allergies were reviewed. The patient's tolerance of                            previous anesthesia was also reviewed. The risks                            and benefits of the procedure and the sedation                            options and risks were discussed with the patient.                            All questions were answered, and informed consent                            was obtained. Prior Anticoagulants: The patient has                            taken no previous anticoagulant or antiplatelet                            agents. ASA Grade Assessment: II - A patient with                            mild systemic disease. After reviewing the risks                            and benefits, the patient was deemed in                            satisfactory condition to undergo the procedure.  After obtaining informed consent, the colonoscope                            was passed under direct vision. Throughout the                            procedure, the patient's blood pressure, pulse, and                            oxygen saturations were monitored continuously. The                            Olympus CF-HQ190L (Serial# [id number]) Colonoscope was                            introduced through the  anus and advanced to the the                            cecum, identified by appendiceal orifice and                            ileocecal valve. The ileocecal valve, appendiceal                            orifice, and rectum were photographed. The quality                            of the bowel preparation was excellent. The                            colonoscopy was performed without difficulty. The                            patient tolerated the procedure well. The bowel                            preparation used was SUPREP via split dose                            instruction. Scope In: 11:57:54 AM Scope Out: 12:09:42 PM Scope Withdrawal Time: 0 hours 7 minutes 31 seconds  Total Procedure Duration: 0 hours 11 minutes 48 seconds  Findings:                 A few small-mouthed diverticula were found in the                            sigmoid colon and ascending colon.                           Internal hemorrhoids were found during                            retroflexion. The hemorrhoids were small.  The exam was otherwise without abnormality on                            direct and retroflexion views. Complications:            No immediate complications. Estimated blood loss:                            None. Estimated Blood Loss:     Estimated blood loss: none. Impression:               - Diverticulosis in the sigmoid colon and in the                            ascending colon.                           - Internal hemorrhoids.                           - The examination was otherwise normal on direct                            and retroflexion views.                           - No specimens collected. Recommendation:           - Repeat colonoscopy in 10 years for screening                            purposes.                           - Patient has a contact number available for                            emergencies. The signs and symptoms of potential                             delayed complications were discussed with the                            patient. Return to normal activities tomorrow.                            Written discharge instructions were provided to the                            patient.                           - Resume previous diet.                           - Continue present medications. Wilhemina Bonito. Marina Goodell, MD 03/01/2022 12:16:47 PM This report has been signed electronically.

## 2022-03-01 NOTE — Progress Notes (Signed)
PT taken to PACU. Monitors in place. VSS. Report given to RN. 

## 2022-03-01 NOTE — Progress Notes (Signed)
Pt's states no medical or surgical changes since previsit or office visit. 

## 2022-03-02 ENCOUNTER — Telehealth: Payer: Self-pay

## 2022-03-02 NOTE — Telephone Encounter (Signed)
Left message on follow up call. 

## 2022-03-27 ENCOUNTER — Other Ambulatory Visit: Payer: Self-pay | Admitting: Family Medicine

## 2022-04-26 ENCOUNTER — Other Ambulatory Visit: Payer: Self-pay | Admitting: Family Medicine

## 2022-05-27 ENCOUNTER — Other Ambulatory Visit: Payer: Self-pay | Admitting: Family Medicine

## 2022-06-02 ENCOUNTER — Other Ambulatory Visit: Payer: Self-pay | Admitting: Family Medicine

## 2022-06-02 DIAGNOSIS — G43709 Chronic migraine without aura, not intractable, without status migrainosus: Secondary | ICD-10-CM

## 2022-07-28 ENCOUNTER — Other Ambulatory Visit: Payer: Commercial Managed Care - PPO

## 2022-07-28 DIAGNOSIS — R899 Unspecified abnormal finding in specimens from other organs, systems and tissues: Secondary | ICD-10-CM

## 2022-07-28 LAB — COMPREHENSIVE METABOLIC PANEL
ALT: 12 U/L (ref 0–35)
AST: 16 U/L (ref 0–37)
Albumin: 4 g/dL (ref 3.5–5.2)
Alkaline Phosphatase: 61 U/L (ref 39–117)
BUN: 15 mg/dL (ref 6–23)
CO2: 25 mEq/L (ref 19–32)
Calcium: 9.5 mg/dL (ref 8.4–10.5)
Chloride: 108 mEq/L (ref 96–112)
Creatinine, Ser: 0.89 mg/dL (ref 0.40–1.20)
GFR: 69.67 mL/min (ref >60.00)
Glucose, Bld: 100 mg/dL — ABNORMAL HIGH (ref 70–99)
Potassium: 3.7 mEq/L (ref 3.5–5.1)
Sodium: 141 mEq/L (ref 135–145)
Total Bilirubin: 0.3 mg/dL (ref 0.2–1.2)
Total Protein: 6.9 g/dL (ref 6.0–8.3)

## 2022-08-22 ENCOUNTER — Other Ambulatory Visit: Payer: Self-pay | Admitting: Family Medicine

## 2022-08-23 ENCOUNTER — Other Ambulatory Visit: Payer: Self-pay | Admitting: Neurology

## 2022-08-23 MED ORDER — NURTEC 75 MG PO TBDP: 75.0000 mg | ORAL_TABLET | Freq: Every day | ORAL | 0 refills | Status: DC | PRN

## 2022-08-24 ENCOUNTER — Ambulatory Visit: Payer: Commercial Managed Care - PPO | Admitting: Family Medicine

## 2022-08-24 ENCOUNTER — Ambulatory Visit (INDEPENDENT_AMBULATORY_CARE_PROVIDER_SITE_OTHER): Payer: Commercial Managed Care - PPO

## 2022-08-24 ENCOUNTER — Encounter: Payer: Self-pay | Admitting: Family Medicine

## 2022-08-24 VITALS — BP 128/78 | HR 67 | Temp 98.3°F | Ht 65.0 in | Wt 145.8 lb

## 2022-08-24 DIAGNOSIS — M545 Low back pain, unspecified: Secondary | ICD-10-CM

## 2022-08-24 DIAGNOSIS — R634 Abnormal weight loss: Secondary | ICD-10-CM | POA: Diagnosis not present

## 2022-08-24 DIAGNOSIS — R7303 Prediabetes: Secondary | ICD-10-CM | POA: Diagnosis not present

## 2022-08-24 LAB — CBC WITH DIFFERENTIAL/PLATELET
Basophils Absolute: 0 10*3/uL (ref 0.0–0.1)
Basophils Relative: 0.4 % (ref 0.0–3.0)
Eosinophils Absolute: 0.1 10*3/uL (ref 0.0–0.7)
Eosinophils Relative: 1.7 % (ref 0.0–5.0)
HCT: 39.2 % (ref 36.0–46.0)
Hemoglobin: 12.9 g/dL (ref 12.0–15.0)
Lymphocytes Relative: 56.8 % — ABNORMAL HIGH (ref 12.0–46.0)
Lymphs Abs: 2 10*3/uL (ref 0.7–4.0)
MCHC: 32.9 g/dL (ref 30.0–36.0)
MCV: 91.5 fl (ref 78.0–100.0)
Monocytes Absolute: 0.3 10*3/uL (ref 0.1–1.0)
Monocytes Relative: 7.3 % (ref 3.0–12.0)
Neutro Abs: 1.2 10*3/uL — ABNORMAL LOW (ref 1.4–7.7)
Neutrophils Relative %: 33.8 % — ABNORMAL LOW (ref 43.0–77.0)
Platelets: 245 10*3/uL (ref 150.0–400.0)
RBC: 4.29 Mil/uL (ref 3.87–5.11)
RDW: 13.3 % (ref 11.5–15.5)
WBC: 3.6 10*3/uL — ABNORMAL LOW (ref 4.0–10.5)

## 2022-08-24 LAB — TSH: TSH: 0.37 u[IU]/mL (ref 0.35–5.50)

## 2022-08-24 LAB — T4, FREE: Free T4: 0.77 ng/dL (ref 0.60–1.60)

## 2022-08-24 LAB — HEMOGLOBIN A1C: Hgb A1c MFr Bld: 5.8 % (ref 4.6–6.5)

## 2022-08-24 MED ORDER — CYCLOBENZAPRINE HCL 5 MG PO TABS: 5.0000 mg | ORAL_TABLET | Freq: Every evening | ORAL | 0 refills | Status: DC | PRN

## 2022-08-24 NOTE — Progress Notes (Signed)
   Established Patient Office Visit   Subjective  Patient ID: Evelyn Edwards, female    DOB: 1961/03/31  Age: 62 y.o. MRN: [id number]  No chief complaint on file.   Patient is a 62 year old female with pmh sig for migraines who was seen for acute concern.  Patient endorses bilateral low back pain x 4-7 days.  Pain is a constant, 8-10/10. Standing eases the pain and sometimes sitting.  Patient denies pain in bilateral legs, loss of bowel or bladder, weakness in bilateral LEs, injury, pushing/pulling, heavy lifting, tossing and turning at night.  Patient states mattress is fairly new.  Tried ibuprofen as needed.  Patient notes continued unintentional weight loss.  Lost 4 pounds since September.  Denies changes in diet.      ROS Negative unless stated above    Objective:     BP 128/78 (BP Location: Left Arm, Patient Position: Sitting, Cuff Size: Normal)   Pulse 67   Temp 98.3 F (36.8 C) (Oral)   Ht 5\' 5"  (1.651 m)   Wt 145 lb 12.8 oz (66.1 kg)   SpO2 100%   BMI 24.26 kg/m    Physical Exam Constitutional:      General: She is not in acute distress.    Appearance: Normal appearance.  HENT:     Head: Normocephalic and atraumatic.     Nose: Nose normal.     Mouth/Throat:     Mouth: Mucous membranes are moist.  Cardiovascular:     Rate and Rhythm: Normal rate and regular rhythm.     Heart sounds: Normal heart sounds. No murmur heard.    No gallop.  Pulmonary:     Effort: Pulmonary effort is normal. No respiratory distress.     Breath sounds: Normal breath sounds. No wheezing, rhonchi or rales.  Musculoskeletal:     Cervical back: Normal.     Thoracic back: Normal.     Lumbar back: Tenderness present. No swelling, edema or spasms.       Back:  Skin:    General: Skin is warm and dry.  Neurological:     Mental Status: She is alert and oriented to person, place, and time.      No results found for any visits on 08/24/22.    Assessment & Plan:  Acute  bilateral low back pain without sciatica -Symptoms likely 2/2 muscle strain.  Also consider arthritis -Will obtain imaging -Supportive care with OTC medications, topical analgesics, heat, etc. -For continued or worsening symptoms consider PT -     DG Lumbar Spine 2-3 Views; Future -     Cyclobenzaprine HCl; Take 1 tablet (5 mg total) by mouth at bedtime as needed for muscle spasms.  Dispense: 30 tablet; Refill: 0 -     CBC with Differential/Platelet  Weight loss -4 pound unintentional weight loss since September 2023. -Obtain labs -Continue balanced diet -Mammogram done 12/09/2021 and colonoscopy 03/01/2022 up-to-date -For continued or worsening symptoms consider imaging -     T4, free -     CBC with Differential/Platelet -     Hemoglobin A1c  Prediabetes -Hemoglobin A1c 6.0% on 12/23/2021 -     Hemoglobin A1c    Return in about 6 weeks (around 10/05/2022).   Billie Ruddy, MD

## 2022-08-24 NOTE — Patient Instructions (Addendum)
Prescription for muscle relaxer was sent to your pharmacy.  The bottles take at night as needed.  If the medication does not make make you sleepy you can take it up to 3 times a day as needed.  I also ordered labs for you thyroid, hemoglobin A1c CBC to further evaluate weight loss.  You just had your renal function and liver function checked few weeks ago and so I did not include this.

## 2022-09-21 ENCOUNTER — Other Ambulatory Visit: Payer: Self-pay | Admitting: Family Medicine

## 2022-09-21 DIAGNOSIS — R634 Abnormal weight loss: Secondary | ICD-10-CM

## 2022-09-21 DIAGNOSIS — D709 Neutropenia, unspecified: Secondary | ICD-10-CM

## 2022-09-27 ENCOUNTER — Encounter: Payer: Self-pay | Admitting: Neurology

## 2022-09-27 ENCOUNTER — Ambulatory Visit: Payer: Commercial Managed Care - PPO | Admitting: Neurology

## 2022-09-27 VITALS — BP 124/68 | HR 67 | Ht 65.0 in | Wt 147.8 lb

## 2022-09-27 DIAGNOSIS — G43711 Chronic migraine without aura, intractable, with status migrainosus: Secondary | ICD-10-CM | POA: Diagnosis not present

## 2022-09-27 MED ORDER — TOPIRAMATE 100 MG PO TABS: 100.0000 mg | ORAL_TABLET | Freq: Two times a day (BID) | ORAL | 4 refills | Status: DC

## 2022-09-27 MED ORDER — FREMANEZUMAB-VFRM 225 MG/1.5ML ~~LOC~~ SOSY
225.0000 mg | PREFILLED_SYRINGE | Freq: Once | SUBCUTANEOUS | Status: AC
Start: 2022-09-27 — End: ?

## 2022-09-27 MED ORDER — ONDANSETRON 4 MG PO TBDP
4.0000 mg | ORAL_TABLET | Freq: Three times a day (TID) | ORAL | 3 refills | Status: DC | PRN
Start: 2022-09-27 — End: 2023-04-18

## 2022-09-27 MED ORDER — NARATRIPTAN HCL 2.5 MG PO TABS
2.5000 mg | ORAL_TABLET | ORAL | 0 refills | Status: DC | PRN
Start: 2022-09-27 — End: 2022-11-15

## 2022-09-27 MED ORDER — AJOVY 225 MG/1.5ML ~~LOC~~ SOAJ
225.0000 mg | SUBCUTANEOUS | 11 refills | Status: DC
Start: 2022-09-27 — End: 2023-04-18

## 2022-09-27 NOTE — Patient Instructions (Addendum)
WHen you have a migraine: 1 pill AMerge and 1 pill ONdansetron then can repeat in 2 hours Prevention: Ajovy monthly  Fremanezumab Injection What is this medication? FREMANEZUMAB (fre ma NEZ ue mab) prevents migraines. It works by blocking a substance in the body that causes migraines. It is a monoclonal antibody. This medicine may be used for other purposes; ask your health care provider or pharmacist if you have questions. COMMON BRAND NAME(S): AJOVY What should I tell my care team before I take this medication? They need to know if you have any of these conditions: An unusual or allergic reaction to fremanezumab, other medications, foods, dyes, or preservatives Pregnant or trying to get pregnant Breast-feeding How should I use this medication? This medication is injected under the skin. You will be taught how to prepare and give it. Take it as directed on the prescription label. Keep taking it unless your care team tells you to stop. It is important that you put your used needles and syringes in a special sharps container. Do not put them in a trash can. If you do not have a sharps container, call your pharmacist or care team to get one. Talk to your care team about the use of this medication in children. Special care may be needed. Overdosage: If you think you have taken too much of this medicine contact a poison control center or emergency room at once. NOTE: This medicine is only for you. Do not share this medicine with others. What if I miss a dose? If you miss a dose, take it as soon as you can. If it is almost time for your next dose, take only that dose. Do not take double or extra doses. What may interact with this medication? Interactions are not expected. This list may not describe all possible interactions. Give your health care provider a list of all the medicines, herbs, non-prescription drugs, or dietary supplements you use. Also tell them if you smoke, drink alcohol, or use  illegal drugs. Some items may interact with your medicine. What should I watch for while using this medication? Tell your care team if your symptoms do not start to get better or if they get worse. What side effects may I notice from receiving this medication? Side effects that you should report to your care team as soon as possible: Allergic reactions or angioedema--skin rash, itching or hives, swelling of the face, eyes, lips, tongue, arms, or legs, trouble swallowing or breathing Side effects that usually do not require medical attention (report to your care team if they continue or are bothersome): Pain, redness, or irritation at injection site This list may not describe all possible side effects. Call your doctor for medical advice about side effects. You may report side effects to FDA at 1-800-FDA-1088. Where should I keep my medication? Keep out of the reach of children and pets. Store in a refrigerator or at room temperature between 20 and 25 degrees C (68 and 77 degrees F). Refrigeration (preferred): Store in the refrigerator. Do not freeze. Keep in the original container until you are ready to take it. Remove the dose from the carton about 30 minutes before it is time for you to use it. If the dose is not used, it may be stored in the original container at room temperature for 7 days. Get rid of any unused medication after the expiration date. Room Temperature: This medication may be stored at room temperature for up to 7 days. Keep it in the  original container. Protect from light until time of use. If it is stored at room temperature, get rid of any unused medication after 7 days or after it expires, whichever is first. To get rid of medications that are no longer needed or have expired: Take the medication to a medication take-back program. Check with your pharmacy or law enforcement to find a location. If you cannot return the medication, ask your pharmacist or care team how to get rid  of this medication safely. NOTE: This sheet is a summary. It may not cover all possible information. If you have questions about this medicine, talk to your doctor, pharmacist, or health care provider.  2023 [employer]/Gold Standard (2021-07-13 00:00:00) Ondansetron Dissolving Tablets What is this medication? ONDANSETRON (on DAN se tron) prevents nausea and vomiting from chemotherapy, radiation, or surgery. It works by blocking substances in the body that may cause nausea or vomiting. It belongs to a group of medications called antiemetics. This medicine may be used for other purposes; ask your health care provider or pharmacist if you have questions. COMMON BRAND NAME(S): Zofran ODT What should I tell my care team before I take this medication? They need to know if you have any of these conditions: Heart disease History of irregular heartbeat Liver disease Low levels of magnesium or potassium in the blood An unusual or allergic reaction to ondansetron, granisetron, other medications, foods, dyes, or preservatives Pregnant or trying to get pregnant Breast-feeding How should I use this medication? These tablets are made to dissolve in the mouth. Do not try to push the tablet through the foil backing. With dry hands, peel away the foil backing and gently remove the tablet. Place the tablet in the mouth and allow it to dissolve, then swallow. While you may take these tablets with water, it is not necessary to do so. Talk to your care team regarding the use of this medication in children. Special care may be needed. Overdosage: If you think you have taken too much of this medicine contact a poison control center or emergency room at once. NOTE: This medicine is only for you. Do not share this medicine with others. What if I miss a dose? If you miss a dose, take it as soon as you can. If it is almost time for your next dose, take only that dose. Do not take double or extra doses. What may interact  with this medication? Do not take this medication with any of the following: Apomorphine Certain medications for fungal infections like fluconazole, itraconazole, ketoconazole, posaconazole, voriconazole Cisapride Dronedarone Pimozide Thioridazine This medication may also interact with the following: Carbamazepine Certain medications for depression, anxiety, or psychotic disturbances Fentanyl Linezolid MAOIs like Carbex, Eldepryl, Marplan, Nardil, and Parnate Methylene blue (injected into a vein) Other medications that prolong the QT interval (cause an abnormal heart rhythm) like dofetilide, ziprasidone Phenytoin Rifampicin Tramadol This list may not describe all possible interactions. Give your health care provider a list of all the medicines, herbs, non-prescription drugs, or dietary supplements you use. Also tell them if you smoke, drink alcohol, or use illegal drugs. Some items may interact with your medicine. What should I watch for while using this medication? Check with your care team as soon as you can if you have any sign of an allergic reaction. What side effects may I notice from receiving this medication? Side effects that you should report to your care team as soon as possible: Allergic reactions--skin rash, itching, hives, swelling of the face, lips,  tongue, or throat Bowel blockage--stomach cramping, unable to have a bowel movement or pass gas, loss of appetite, vomiting Chest pain (angina)--pain, pressure, or tightness in the chest, neck, back, or arms Heart rhythm changes--fast or irregular heartbeat, dizziness, feeling faint or lightheaded, chest pain, trouble breathing Irritability, confusion, fast or irregular heartbeat, muscle stiffness, twitching muscles, sweating, high fever, seizure, chills, vomiting, diarrhea, which may be signs of serotonin syndrome Side effects that usually do not require medical attention (report to your care team if they continue or are  bothersome): Constipation Diarrhea General discomfort and fatigue Headache This list may not describe all possible side effects. Call your doctor for medical advice about side effects. You may report side effects to FDA at 1-800-FDA-1088. Where should I keep my medication? Keep out of the reach of children and pets. Store between 2 and 30 degrees C (36 and 86 degrees F). Throw away any unused medication after the expiration date. NOTE: This sheet is a summary. It may not cover all possible information. If you have questions about this medicine, talk to your doctor, pharmacist, or health care provider.  2023 [employer]/Gold Standard (2007-07-14 00:00:00) Naratriptan Tablets What is this medication? NARATRIPTAN (NAR a trip tan) treats migraines. It works by blocking pain signals and narrowing blood vessels in the brain. It belongs to a group of medications called triptans. It is not used to prevent migraines. This medicine may be used for other purposes; ask your health care provider or pharmacist if you have questions. COMMON BRAND NAME(S): Amerge What should I tell my care team before I take this medication? They need to know if you have any of these conditions: Cigarette smoker Circulation problems in fingers and toes Diabetes Heart disease High blood pressure High cholesterol History of irregular heartbeat History of stroke Kidney disease Liver disease Stomach or intestine problems An unusual or allergic reaction to naratriptan, other medications, foods, dyes, or preservatives Pregnant or trying to get pregnant Breast-feeding How should I use this medication? Take this medication by mouth with a glass of water. Follow the directions on the prescription label. Do not take it more often than directed. Talk to your care team regarding the use of this medication in children. Special care may be needed. Overdosage: If you think you have taken too much of this medicine contact a poison  control center or emergency room at once. NOTE: This medicine is only for you. Do not share this medicine with others. What if I miss a dose? This does not apply. This medication is not for regular use. What may interact with this medication? Do not take this medication with any of the following: Ergot alkaloids like dihydroergotamine, ergonovine, ergotamine, methylergonovine Certain medications for migraine headache like almotriptan, eletriptan, frovatriptan, naratriptan, rizatriptan, sumatriptan, zolmitriptan This medication may also interact with the following: Certain medications for depression, anxiety, or psychotic disorders This list may not describe all possible interactions. Give your health care provider a list of all the medicines, herbs, non-prescription drugs, or dietary supplements you use. Also tell them if you smoke, drink alcohol, or use illegal drugs. Some items may interact with your medicine. What should I watch for while using this medication? Visit your care team for regular checks on your progress. Tell your care team if your symptoms do not start to get better or if they get worse. You may get drowsy or dizzy. Do not drive, use machinery, or do anything that needs mental alertness until you know how this medication affects you.  Do not stand up or sit up quickly, especially if you are an older patient. This reduces the risk of dizzy or fainting spells. Alcohol may interfere with the effect of this medication. Tell your care team right away if you have any change in your eyesight. If you take migraine medications for 10 or more days a month, your migraines may get worse. Keep a diary of headache days and medication use. Contact your care team if your migraine attacks occur more frequently. What side effects may I notice from receiving this medication? Side effects that you should report to your care team as soon as possible: Allergic reactions--skin rash, itching, hives,  swelling of the face, lips, tongue, or throat Burning, pain, tingling, or color changes in the legs or feet Heart attack--pain or tightness in the chest, shoulders, arms, or jaw, nausea, shortness of breath, cold or clammy skin, feeling faint or lightheaded Heart rhythm changes--fast or irregular heartbeat, dizziness, feeling faint or lightheaded, chest pain, trouble breathing Increase in blood pressure Irritability, confusion, fast or irregular heartbeat, muscle stiffness, twitching muscles, sweating, high fever, seizure, chills, vomiting, diarrhea, which may be signs of serotonin syndrome Raynaud's--cool, numb, or painful fingers or toes that may change color from pale, to blue, to red Seizures Stroke--sudden numbness or weakness of the face, arm, or leg, trouble speaking, confusion, trouble walking, loss of balance or coordination, dizziness, severe headache, change in vision Sudden or severe stomach pain, nausea, vomiting, fever, or bloody diarrhea Vision loss Side effects that usually do not require medical attention (report to your care team if they continue or are bothersome): Dizziness General discomfort or fatigue This list may not describe all possible side effects. Call your doctor for medical advice about side effects. You may report side effects to FDA at 1-800-FDA-1088. Where should I keep my medication? Keep out of the reach of children and pets. Store at room temperature between 20 and 25 degrees C (68 and 77 degrees F). Throw away any unused medication after the expiration date. NOTE: This sheet is a summary. It may not cover all possible information. If you have questions about this medicine, talk to your doctor, pharmacist, or health care provider.  2023 [employer]/Gold Standard (2020-07-01 00:00:00)

## 2022-09-27 NOTE — Progress Notes (Signed)
[facility]    Provider:  Dr Lucia Gaskins Requesting Provider: Deeann Saint, MD Primary Care Provider:  Deeann Saint, MD  CC:  migraines  September 27, 2022: Evelyn Edwards is a 62 year old female who we saw in 2021 for migraines, reviewed chart: at the time she was uninsured to try to help her get: Financial assistance we did not see her again she was referred back for migraines last July 2023 by Dr. Salomon Fick.  I reviewed Dr. Salomon Fick notes from 12/23/2021 close to 10 months ago and at the time she had shown up for a comprehensive physical exam, at that time she will take tramadol sparingly for headaches, diagnosed with migraine but she claims previous primary care diagnosed with cluster headaches, they referred back to [place].  She was seen again by Dr. Salomon Fick in March 2024 but no mention of migraines at that time her headaches.  At least 8 migraine days a month and > 15 headaches days a month. Pulsating/pounding/throbbing,nausea,photo/phonophobia, nausea, they can last 24 hours and can be severe. No changes in quality or severity or frequency since last being seen, no vision changes, nomorning headaches, not exertional, no aura, No other focal neurologic deficits, associated symptoms, inciting events or modifiable factors.  From a thorough review of records, Medications tried include : Topiramate, amerge(helped acutely), nurtec, fleril, zofran, rizatriptan, sumatriptan, celexa, nadolol, zomig, amitriptyline  Reviewed notes, labs and imaging from outside physicians, which showed: see above and below  Recent Results (from the past 2160 hour(s))  CMP     Status: Abnormal   Collection Time: 07/28/22  8:17 AM  Result Value Ref Range   Sodium 141 135 - 145 mEq/L   Potassium 3.7 3.5 - 5.1 mEq/L   Chloride 108 96 - 112 mEq/L   CO2 25 19 - 32 mEq/L   Glucose, Bld 100 (H) 70 - 99 mg/dL   BUN 15 6 - 23 mg/dL   Creatinine, Ser 1.61 0.40 - 1.20 mg/dL   Total Bilirubin 0.3 0.2 - 1.2 mg/dL    Alkaline Phosphatase 61 39 - 117 U/L   AST 16 0 - 37 U/L   ALT 12 0 - 35 U/L   Total Protein 6.9 6.0 - 8.3 g/dL   Albumin 4.0 3.5 - 5.2 g/dL   GFR 09.60 >45.40 mL/min    Comment: Calculated using the CKD-EPI Creatinine Equation (2021)   Calcium 9.5 8.4 - 10.5 mg/dL  TSH     Status: None   Collection Time: 08/24/22 11:33 AM  Result Value Ref Range   TSH 0.37 0.35 - 5.50 uIU/mL  T4, Free     Status: None   Collection Time: 08/24/22 11:33 AM  Result Value Ref Range   Free T4 0.77 0.60 - 1.60 ng/dL    Comment: Specimens from patients who are undergoing biotin therapy and /or ingesting biotin supplements may contain high levels of biotin.  The higher biotin concentration in these specimens interferes with this Free T4 assay.  Specimens that contain high levels  of biotin may cause false high results for this Free T4 assay.  Please interpret results in light of the total clinical presentation of the patient.    CBC with Differential/Platelet     Status: Abnormal   Collection Time: 08/24/22 11:33 AM  Result Value Ref Range   WBC 3.6 (L) 4.0 - 10.5 K/uL   RBC 4.29 3.87 - 5.11 Mil/uL   Hemoglobin 12.9 12.0 - 15.0 g/dL   HCT 98.1 19.1 - 47.8 %  MCV 91.5 78.0 - 100.0 fl   MCHC 32.9 30.0 - 36.0 g/dL   RDW 78.2 95.6 - 21.3 %   Platelets 245.0 150.0 - 400.0 K/uL   Neutrophils Relative % 33.8 (L) 43.0 - 77.0 %   Lymphocytes Relative 56.8 Repeated and verified X2. (H) 12.0 - 46.0 %   Monocytes Relative 7.3 3.0 - 12.0 %   Eosinophils Relative 1.7 0.0 - 5.0 %   Basophils Relative 0.4 0.0 - 3.0 %   Neutro Abs 1.2 (L) 1.4 - 7.7 K/uL   Lymphs Abs 2.0 0.7 - 4.0 K/uL   Monocytes Absolute 0.3 0.1 - 1.0 K/uL   Eosinophils Absolute 0.1 0.0 - 0.7 K/uL   Basophils Absolute 0.0 0.0 - 0.1 K/uL  Hemoglobin A1c     Status: None   Collection Time: 08/24/22 11:33 AM  Result Value Ref Range   Hgb A1c MFr Bld 5.8 4.6 - 6.5 %    Comment: Glycemic Control Guidelines for People with Diabetes:Non Diabetic:   <6%Goal of Therapy: <7%Additional Action Suggested:  >8%     Patient complains of symptoms per HPI as well as the following symptoms: migraines . Pertinent negatives and positives per HPI. All others negative   HPI 01/22/2020:  Evelyn Edwards is a 62 y.o. female here as requested by Deeann Saint, MD for migraines.  Past medical history anxiety, headache, migraine.  I reviewed Dr. Salomon Fick notes, patient has migraines and vertigo, she was last seen after having an acute headache with dull pain in the frontal area right greater than left, typically takes Topamax daily and Maxalt as needed, she reported drinking more water and denied increased caffeine intake, trouble sleeping, nausea, vomiting, sensitivity to light or sound, she has a headache once every 2 weeks and her sister has migraines and her daughter has a history of pseudotumor cerebri.  Patient denies ever having images in regards to her headaches.  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  Patient does have a history of migraines and I found notes from 2019 where she was also taking Maxalt and topiramate at that time 100 mg in the morning and 120 5 in the afternoon, she was also taking tramadol as needed for headaches, and at that time reported a migraine a few times a week or more frequently.  In a note from 2019 and even prior in 2018 by Dr. Tawanna Cooler she was also diagnosed with chronic cluster headaches however there may be a missed diagnosis as I do not see anything in the note regarding cluster, and I see notes as far back as 2015 with headaches.   She is here alone. Migraines started worsening 2010 but she has had them since she was young, started worenin gin 2010 with a stressful job. Slowly progressive. Starts in the forehead and temples,  severe throbbing/pulsating, nausea, no vomiting, +photophonoa/phonophobia, movement makes it worse. Can last 24 hours. Laying down in a dark room helps. They can be severe. She has them 1x a week, no other headaches. 2-3x a month, they can be severe and keep her out of work. Tried maxalt and imitrex and they don;t work anymore. Tramadol helps. Prior to the topamax she was having at least 15 migraine days a month. No side effects to the  topamax. She has methocarbamol as well. She knows about goodrx and we will help with cone financial assistance today she is self pay. Not positional or exertional, no vision problems. No other focal neurologic deficits, associated symptoms, inciting events or modifiable factors.  Reviewed notes, labs and imaging from outside physicians, which showed: see above  BMP/cbc 09/04/2019 normal  Review of Systems: Patient complains of symptoms per HPI as well as the following symptoms: headaches. Pertinent negatives and positives per HPI. All others negative.   Social History   Socioeconomic History   Marital status: Single    Spouse name: Not on file   Number of children: 1   Years of education: Not on file   Highest education level: Some college, no degree  Occupational History   Occupation: Retail  Tobacco Use   Smoking status: Never   Smokeless tobacco: Never  Vaping Use   Vaping Use: Never used  Substance and Sexual Activity   Alcohol use: No   Drug use: No   Sexual activity: Not on file  Other Topics Concern   Not on file  Social History Narrative   Not on file   Social Determinants of Health   Financial Resource Strain: Low Risk  (01/27/2022)   Overall Financial Resource Strain (CARDIA)    Difficulty of Paying Living Expenses: Not hard at all  Food Insecurity: No Food Insecurity (01/27/2022)   Hunger Vital Sign    Worried About Running Out of Food in the Last Year: Never true    Ran Out of Food in the Last Year: Never true  Transportation Needs:  No Transportation Needs (01/27/2022)   PRAPARE - Administrator, Civil Service (Medical): No    Lack of Transportation (Non-Medical): No  Physical Activity: Not on file  Stress: No Stress Concern Present (01/27/2022)   [employer] of Occupational Health - Occupational Stress Questionnaire    Feeling of Stress : Not at all  Social Connections: Unknown (01/27/2022)   Social Connection and Isolation Panel [NHANES]    Frequency of Communication with Friends and Family: More than three times a week    Frequency of Social Gatherings with Friends and Family: More than three times a week    Attends Religious Services: More than 4 times per year    Active Member of [employer] or Organizations: Not on file    Attends Engineer, structural: Not on file    Marital Status: Divorced  Catering manager Violence: Not on file    Family History  Problem Relation Age of Onset   Asthma Mother    Hypertension Father    Diabetes Father    Coronary artery disease Father    Colon polyps Father    Prostate cancer Father    Lupus Sister    Migraines Sister    Anxiety disorder Other    Colon cancer Neg Hx    Esophageal cancer Neg Hx    Stomach cancer Neg Hx    Rectal cancer Neg Hx     Past Medical History:  Diagnosis Date   Anxiety    Headache(784.0)     Patient Active Problem List   Diagnosis Date Noted   Chronic migraine without aura, with intractable migraine, so stated, with status migrainosus 09/27/2022   Acute bilateral low back pain 04/04/2020   Migraine without aura and without status migrainosus, not intractable 01/22/2020   Weight loss 07/04/2017   Benign paroxysmal positional vertigo 10/29/2014   BREAST MASS, RIGHT 05/28/2009  HEADACHE 10/31/2007    Past Surgical History:  Procedure Laterality Date   ABDOMINAL HYSTERECTOMY  2003   ovaries were left intact   WRIST SURGERY      Current Outpatient Medications  Medication Sig Dispense Refill    cyclobenzaprine (FLEXERIL) 5 MG tablet Take 1 tablet (5 mg total) by mouth at bedtime as needed for muscle spasms. 30 tablet 0   Fremanezumab-vfrm (AJOVY) 225 MG/1.5ML SOAJ Inject 225 mg into the skin every 30 (thirty) days. PLEASE USE COPAY CARD: BIN#: [id number] PCN#: PDMI GROUP#: [id number] ID#: [id number] EXPIRES: 06/06/2023 1.5 mL 11   Multiple Vitamin (MULTIVITAMIN PO) Take by mouth.     naratriptan (AMERGE) 2.5 MG tablet Take 1 tablet (2.5 mg total) by mouth as needed for migraine. Take one (1) tablet at onset of headache; if returns or does not resolve, may repeat after 2 hours; do not exceed five (5) mg in 24 hours. 10 tablet 0   ondansetron (ZOFRAN-ODT) 4 MG disintegrating tablet Take 1-2 tablets (4-8 mg total) by mouth every 8 (eight) hours as needed. 30 tablet 3   traMADol (ULTRAM) 50 MG tablet TAKE 1 TABLET(50 MG) BY MOUTH EVERY 8 HOURS AS NEEDED 60 tablet 3   topiramate (TOPAMAX) 100 MG tablet Take 1 tablet (100 mg total) by mouth 2 (two) times daily. 180 tablet 4   Current Facility-Administered Medications  Medication Dose Route Frequency Provider Last Rate Last Admin   Fremanezumab-vfrm SOSY 225 mg  225 mg Subcutaneous Once Anson Fret, MD        Allergies as of 09/27/2022 - Review Complete 09/27/2022  Allergen Reaction Noted   Amoxicillin Hives and Other (See Comments) 09/09/2020    Vitals: BP 124/68   Pulse 67   Ht 5\' 5"  (1.651 m)   Wt 147 lb 12.8 oz (67 kg)   BMI 24.60 kg/m  Last Weight:  Wt Readings from Last 1 Encounters:  09/27/22 147 lb 12.8 oz (67 kg)   Last Height:   Ht Readings from Last 1 Encounters:  09/27/22 5\' 5"  (1.651 m)    Physical exam: Exam: Gen: NAD, conversant, well nourised, well groomed                     CV: RRR, no MRG. No Carotid Bruits. No peripheral edema, warm, nontender Eyes: Conjunctivae clear without exudates or hemorrhage  Neuro: Detailed Neurologic Exam  Speech:    Speech is normal; fluent and spontaneous with normal  comprehension.  Cognition:    The patient is oriented to person, place, and time;     recent and remote memory intact;     language fluent;     normal attention, concentration,     fund of knowledge Cranial Nerves:    The pupils are equal, round, and reactive to light. The fundi are normal and spontaneous venous pulsations are present. Visual fields are full to finger confrontation. Extraocular movements are intact. Trigeminal sensation is intact and the muscles of mastication are normal. The face is symmetric. The palate elevates in the midline. Hearing intact. Voice is normal. Shoulder shrug is normal. The tongue has normal motion without fasciculations.   Coordination: nml  Gait: nml  Motor Observation:    No asymmetry, no atrophy, and no involuntary movements noted. Tone:    Normal muscle tone.    Posture:    Posture is normal. normal erect    Strength:    Strength is V/V in the upper and lower limbs.  Sensation: intact to LT     Reflex Exam:  DTR's:    Deep tendon reflexes in the upper and lower extremities are normal bilaterally.   Toes:    The toes are downgoing bilaterally.   Clonus:    Clonus is absent.    Assessment/Plan:  Lovely patient with chronic migraines.  In the past she has always just used acute management.  She is not aware of some of the newer medications or migraine management as far as prevention goes.  We did have a long talk today, we discussed the new medication class especially including Ajovy, which is what I would put her on if she decided she would like a preventative, also Emgality, Nurtec, Ubrelvy and some of the others.  Amerge triptan works well for her. She decided to try Ajovy as prevention.  Meds ordered this encounter  Medications   naratriptan (AMERGE) 2.5 MG tablet    Sig: Take 1 tablet (2.5 mg total) by mouth as needed for migraine. Take one (1) tablet at onset of headache; if returns or does not resolve, may repeat after 2  hours; do not exceed five (5) mg in 24 hours.    Dispense:  10 tablet    Refill:  0   ondansetron (ZOFRAN-ODT) 4 MG disintegrating tablet    Sig: Take 1-2 tablets (4-8 mg total) by mouth every 8 (eight) hours as needed.    Dispense:  30 tablet    Refill:  3   Fremanezumab-vfrm SOSY 225 mg    10-2024 [id number]   topiramate (TOPAMAX) 100 MG tablet    Sig: Take 1 tablet (100 mg total) by mouth 2 (two) times daily.    Dispense:  180 tablet    Refill:  4   Fremanezumab-vfrm (AJOVY) 225 MG/1.5ML SOAJ    Sig: Inject 225 mg into the skin every 30 (thirty) days. PLEASE USE COPAY CARD: BIN#: [id number] PCN#: PDMI GROUP#: [id number] ID#: [id number] EXPIRES: 06/06/2023    Dispense:  1.5 mL    Refill:  11    PLEASE USE COPAY CARD: BIN#: [id number] PCN#: PDMI GROUP#: [id number] ID#: [id number] EXPIRES: 06/06/2023     Discussed: To prevent or relieve headaches, try the following: Cool Compress. Lie down and place a cool compress on your head.  Avoid headache triggers. If certain foods or odors seem to have triggered your migraines in the past, avoid them. A headache diary might help you identify triggers.  Include physical activity in your daily routine. Try a daily walk or other moderate aerobic exercise.  Manage stress. Find healthy ways to cope with the stressors, such as delegating tasks on your to-do list.  Practice relaxation techniques. Try deep breathing, yoga, massage and visualization.  Eat regularly. Eating regularly scheduled meals and maintaining a healthy diet might help prevent headaches. Also, drink plenty of fluids.  Follow a regular sleep schedule. Sleep deprivation might contribute to headaches Consider biofeedback. With this mind-body technique, you learn to control certain bodily functions -- such as muscle tension, heart rate and blood pressure -- to prevent headaches or reduce headache pain.    Proceed to emergency room if you experience new or worsening symptoms or symptoms do not resolve,  if you have new neurologic symptoms or if headache is severe, or for any concerning symptom.   Provided education and documentation from American headache Society toolbox including articles on: chronic migraine medication overuse headache, chronic migraines, prevention of migraines, behavioral and other nonpharmacologic treatments for headache.   No  orders of the defined types were placed in this encounter.  Meds ordered this encounter  Medications   naratriptan (AMERGE) 2.5 MG tablet    Sig: Take 1 tablet (2.5 mg total) by mouth as needed for migraine. Take one (1) tablet at onset of headache; if returns or does not resolve, may repeat after 2 hours; do not exceed five (5) mg in 24 hours.    Dispense:  10 tablet    Refill:  0   ondansetron (ZOFRAN-ODT) 4 MG disintegrating tablet    Sig: Take 1-2 tablets (4-8 mg total) by mouth every 8 (eight) hours as needed.    Dispense:  30 tablet    Refill:  3   Fremanezumab-vfrm SOSY 225 mg    10-2024 [id number]   topiramate (TOPAMAX) 100 MG tablet    Sig: Take 1 tablet (100 mg total) by mouth 2 (two) times daily.    Dispense:  180 tablet    Refill:  4   Fremanezumab-vfrm (AJOVY) 225 MG/1.5ML SOAJ    Sig: Inject 225 mg into the skin every 30 (thirty) days. PLEASE USE COPAY CARD: BIN#: [id number] PCN#: PDMI GROUP#: [id number] ID#: [id number] EXPIRES: 06/06/2023    Dispense:  1.5 mL    Refill:  11    PLEASE USE COPAY CARD: BIN#: [id number] PCN#: PDMI GROUP#: [id number] ID#: [id number] EXPIRES: 06/06/2023    Cc: Deeann Saint, MD,  Deeann Saint, MD  Naomie Dean, MD  [facility] [address] [place], [place] [address]  Phone [phone] Fax [phone]  I spent over 55 minutes of face-to-face and non-face-to-face time with patient on the  1. Chronic migraine without aura, with intractable migraine, so stated, with status migrainosus    diagnosis.  This included previsit chart review, lab review, study review,  order entry, electronic health record documentation, patient education on the different diagnostic and therapeutic options, counseling and coordination of care, risks and benefits of management, compliance, or risk factor reduction

## 2022-09-30 NOTE — Progress Notes (Unsigned)
[facility]   Telephone:(336) [phone] Fax:(336) [phone]   Clinic New Consult Note   Patient Care Team: Deeann Saint, MD as PCP - General (Family Medicine) 10/01/2022  CHIEF COMPLAINTS/PURPOSE OF CONSULTATION:  Neutropenia   REFERRAL PHYSICIAN: Dr. Abbe Amsterdam   HISTORY OF PRESENTING ILLNESS:  Evelyn Edwards 62 y.o. female is here because of neutropenia. She was referred by her PCP Dr. Salomon Fick.  She presents to clinic with her daughter.  Based on lab review in her records, she has had intermittent mild leukopenia with neutropenia since 2009, the lowest ANC 1.2, and she had normal WBC and differential most of the time.  She denies any recurrent infections, such as sinus infection, pneumonia, bronchitis or UTI.  She is relatively healthy, only past medical history is migraine, for which she takes Topamax, she has been on it for 5 years.  She denies any history of hepatitis or HIV.  She not drink alcohol.  He feels well overall, review of systems negative.    MEDICAL HISTORY:  Past Medical History:  Diagnosis Date   Anxiety    Headache(784.0)     SURGICAL HISTORY: Past Surgical History:  Procedure Laterality Date   ABDOMINAL HYSTERECTOMY  2003   ovaries were left intact   WRIST SURGERY      SOCIAL HISTORY: Social History   Socioeconomic History   Marital status: Single    Spouse name: Not on file   Number of children: 1   Years of education: Not on file   Highest education level: Some college, no degree  Occupational History   Occupation: Retail  Tobacco Use   Smoking status: Never   Smokeless tobacco: Never  Vaping Use   Vaping Use: Never used  Substance and Sexual Activity   Alcohol use: No   Drug use: No   Sexual activity: Not on file  Other Topics Concern   Not on file  Social History Narrative   Not on file   Social Determinants of Health   Financial Resource Strain: Low Risk  (01/27/2022)   Overall Financial Resource Strain  (CARDIA)    Difficulty of Paying Living Expenses: Not hard at all  Food Insecurity: No Food Insecurity (01/27/2022)   Hunger Vital Sign    Worried About Running Out of Food in the Last Year: Never true    Ran Out of Food in the Last Year: Never true  Transportation Needs: No Transportation Needs (01/27/2022)   PRAPARE - Administrator, Civil Service (Medical): No    Lack of Transportation (Non-Medical): No  Physical Activity: Not on file  Stress: No Stress Concern Present (01/27/2022)   [employer] of Occupational Health - Occupational Stress Questionnaire    Feeling of Stress : Not at all  Social Connections: Unknown (01/27/2022)   Social Connection and Isolation Panel [NHANES]    Frequency of Communication with Friends and Family: More than three times a week    Frequency of Social Gatherings with Friends and Family: More than three times a week    Attends Religious Services: More than 4 times per year    Active Member of [employer] or Organizations: Not on file    Attends Banker Meetings: Not on file    Marital Status: Divorced  Intimate Partner Violence: Not on file    FAMILY HISTORY: Family History  Problem Relation Age of Onset   Asthma Mother    Hypertension Father    Diabetes Father  Coronary artery disease Father    Colon polyps Father    Prostate cancer Father    Lupus Sister    Migraines Sister    Anxiety disorder Other    Colon cancer Neg Hx    Esophageal cancer Neg Hx    Stomach cancer Neg Hx    Rectal cancer Neg Hx     ALLERGIES:  is allergic to amoxicillin.  MEDICATIONS:  Current Outpatient Medications  Medication Sig Dispense Refill   cyclobenzaprine (FLEXERIL) 5 MG tablet Take 1 tablet (5 mg total) by mouth at bedtime as needed for muscle spasms. 30 tablet 0   Fremanezumab-vfrm (AJOVY) 225 MG/1.5ML SOAJ Inject 225 mg into the skin every 30 (thirty) days. PLEASE USE COPAY CARD: BIN#: [id number] PCN#: PDMI GROUP#: [id number] ID#:  [id number] EXPIRES: 06/06/2023 1.5 mL 11   Multiple Vitamin (MULTIVITAMIN PO) Take by mouth.     naratriptan (AMERGE) 2.5 MG tablet Take 1 tablet (2.5 mg total) by mouth as needed for migraine. Take one (1) tablet at onset of headache; if returns or does not resolve, may repeat after 2 hours; do not exceed five (5) mg in 24 hours. 10 tablet 0   ondansetron (ZOFRAN-ODT) 4 MG disintegrating tablet Take 1-2 tablets (4-8 mg total) by mouth every 8 (eight) hours as needed. 30 tablet 3   topiramate (TOPAMAX) 100 MG tablet Take 1 tablet (100 mg total) by mouth 2 (two) times daily. 180 tablet 4   traMADol (ULTRAM) 50 MG tablet TAKE 1 TABLET(50 MG) BY MOUTH EVERY 8 HOURS AS NEEDED 60 tablet 3   Current Facility-Administered Medications  Medication Dose Route Frequency Provider Last Rate Last Admin   Fremanezumab-vfrm SOSY 225 mg  225 mg Subcutaneous Once Anson Fret, MD        REVIEW OF SYSTEMS:   Constitutional: Denies fevers, chills or abnormal night sweats Eyes: Denies blurriness of vision, double vision or watery eyes Ears, nose, mouth, throat, and face: Denies mucositis or sore throat Respiratory: Denies cough, dyspnea or wheezes Cardiovascular: Denies palpitation, chest discomfort or lower extremity swelling Gastrointestinal:  Denies nausea, heartburn or change in bowel habits Skin: Denies abnormal skin rashes Lymphatics: Denies new lymphadenopathy or easy bruising Neurological:Denies numbness, tingling or new weaknesses Behavioral/Psych: Mood is stable, no new changes  All other systems were reviewed with the patient and are negative.  PHYSICAL EXAMINATION: ECOG PERFORMANCE STATUS: 0 - Asymptomatic  Vitals:   10/01/22 0756  BP: 136/70  Pulse: 64  Resp: 20  Temp: (!) 97.3 F (36.3 C)  SpO2: 100%   There were no vitals filed for this visit.  GENERAL:alert, no distress and comfortable SKIN: skin color, texture, turgor are normal, no rashes or significant lesions EYES: normal,  conjunctiva are pink and non-injected, sclera clear OROPHARYNX:no exudate, no erythema and lips, buccal mucosa, and tongue normal  NECK: supple, thyroid normal size, non-tender, without nodularity LYMPH:  no palpable lymphadenopathy in the cervical, axillary or inguinal LUNGS: clear to auscultation and percussion with normal breathing effort HEART: regular rate & rhythm and no murmurs and no lower extremity edema ABDOMEN:abdomen soft, non-tender and normal bowel sounds Musculoskeletal:no cyanosis of digits and no clubbing  PSYCH: alert & oriented x 3 with fluent speech NEURO: no focal motor/sensory deficits  LABORATORY DATA:  I have reviewed the data as listed    Latest Ref Rng & Units 08/24/2022   11:33 AM 12/27/2021    8:54 AM 12/24/2020    8:48 AM  CBC  WBC 4.0 -  10.5 K/uL 3.6  3.7  3.4   Hemoglobin 12.0 - 15.0 g/dL 16.1  09.6  04.5   Hematocrit 36.0 - 46.0 % 39.2  39.4  38.8   Platelets 150.0 - 400.0 K/uL 245.0  220.0  235.0       Latest Ref Rng & Units 07/28/2022    8:17 AM 12/27/2021    8:54 AM 12/24/2020    8:48 AM  CMP  Glucose 70 - 99 mg/dL 409  93  87   BUN 6 - 23 mg/dL 15  12  16    Creatinine 0.40 - 1.20 mg/dL 8.11  9.14  7.82   Sodium 135 - 145 mEq/L 141  141  141   Potassium 3.5 - 5.1 mEq/L 3.7  4.0  4.1   Chloride 96 - 112 mEq/L 108  108  108   CO2 19 - 32 mEq/L 25  26  24    Calcium 8.4 - 10.5 mg/dL 9.5  9.6  9.4   Total Protein 6.0 - 8.3 g/dL 6.9  7.4    Total Bilirubin 0.2 - 1.2 mg/dL 0.3  0.3    Alkaline Phos 39 - 117 U/L 61  62    AST 0 - 37 U/L 16  16    ALT 0 - 35 U/L 12  12       RADIOGRAPHIC STUDIES: I have personally reviewed the radiological images as listed and agreed with the findings in the report. No results found.  ASSESSMENT & PLAN:  62 year old female  Mild chronic intermittent neutropenia -Lab reviewed, she has had intermittent mild leukopenia/neutropenia since 2008, ANC lowest 1.2K, the rest of CBC normal. -I discussed the the common  etiology of neutropenia, including autoimmune related, nutritional such as B12 or folate deficiency, chronic virus infection such as hepatitis B, hepatitis C, and HIV, medication related, ethnic benign neutropenia, especially in African-American (she is), and primary bone marrow disease such as MDS. -She had a hepatitis C test in 2021 which was negative.  I will check hepatitis B and HIV today, also check B12 and folate level.  I will review her peripheral blood smear -If the above workup is negative, this is likely benign, could be related to her ethnic, or benign cyclic neutropenia, or medication related (Topamax).  Given the intermittent mild neutropenia, she does not need any treatment and okay to continue Topamax. -I do not think a bone marrow biopsy is indicated at this point   Plan -Lab today, including CBC and differential, B12, folate, hepatitis B antigen, HIV -I will call her with results, if negative, I will see her as needed.  All questions were answered. The patient knows to call the clinic with any problems, questions or concerns. I spent 20 minutes counseling the patient face to face. The total time spent in the appointment was 30 minutes and more than 50% was on counseling.     Malachy Mood, MD 10/01/2022 8:28 AM

## 2022-10-01 ENCOUNTER — Encounter: Payer: Self-pay | Admitting: Hematology

## 2022-10-01 ENCOUNTER — Inpatient Hospital Stay: Payer: Commercial Managed Care - PPO | Attending: Hematology | Admitting: Hematology

## 2022-10-01 ENCOUNTER — Inpatient Hospital Stay: Payer: Commercial Managed Care - PPO

## 2022-10-01 VITALS — BP 136/70 | HR 64 | Temp 97.3°F | Resp 20

## 2022-10-01 DIAGNOSIS — D709 Neutropenia, unspecified: Secondary | ICD-10-CM

## 2022-10-01 LAB — CBC WITH DIFFERENTIAL (CANCER CENTER ONLY)
Abs Immature Granulocytes: 0.01 10*3/uL (ref 0.00–0.07)
Basophils Absolute: 0 10*3/uL (ref 0.0–0.1)
Basophils Relative: 1 %
Eosinophils Absolute: 0.1 10*3/uL (ref 0.0–0.5)
Eosinophils Relative: 2 %
HCT: 38.8 % (ref 36.0–46.0)
Hemoglobin: 12.7 g/dL (ref 12.0–15.0)
Immature Granulocytes: 0 %
Lymphocytes Relative: 55 %
Lymphs Abs: 1.8 10*3/uL (ref 0.7–4.0)
MCH: 30 pg (ref 26.0–34.0)
MCHC: 32.7 g/dL (ref 30.0–36.0)
MCV: 91.7 fL (ref 80.0–100.0)
Monocytes Absolute: 0.3 10*3/uL (ref 0.1–1.0)
Monocytes Relative: 8 %
Neutro Abs: 1.1 10*3/uL — ABNORMAL LOW (ref 1.7–7.7)
Neutrophils Relative %: 34 %
Platelet Count: 209 10*3/uL (ref 150–400)
RBC: 4.23 MIL/uL (ref 3.87–5.11)
RDW: 13 % (ref 11.5–15.5)
WBC Count: 3.3 10*3/uL — ABNORMAL LOW (ref 4.0–10.5)
nRBC: 0 % (ref 0.0–0.2)

## 2022-10-01 LAB — HEPATITIS B SURFACE ANTIGEN: Hepatitis B Surface Ag: NONREACTIVE

## 2022-10-01 LAB — FOLATE: Folate: 26.4 ng/mL (ref >5.9)

## 2022-10-01 LAB — TECHNOLOGIST SMEAR REVIEW: Plt Morphology: NORMAL

## 2022-10-01 LAB — HIV ANTIBODY (ROUTINE TESTING W REFLEX): HIV Screen 4th Generation wRfx: NONREACTIVE

## 2022-10-01 LAB — HEPATITIS B SURFACE ANTIBODY,QUALITATIVE: Hep B S Ab: NONREACTIVE

## 2022-10-01 LAB — VITAMIN B12: Vitamin B-12: 662 pg/mL (ref 180–914)

## 2022-10-03 ENCOUNTER — Telehealth: Payer: Self-pay

## 2022-10-03 NOTE — Telephone Encounter (Addendum)
Called patient to relay message below no answer to phone call  so I sent her a MyChart message to relay what Dr. Mosetta Putt had said.   ----- Message from Malachy Mood, MD sent at 10/03/2022  7:54 AM EDT ----- Please let pt know her lab results, no HBV, HIV infection, folate and B12 levels are normal, I iwll see her as needed in furfures.   Malachy Mood  10/03/2022

## 2022-10-13 ENCOUNTER — Encounter: Payer: Self-pay | Admitting: *Deleted

## 2022-10-13 ENCOUNTER — Encounter: Payer: Self-pay | Admitting: Neurology

## 2022-11-14 ENCOUNTER — Other Ambulatory Visit: Payer: Self-pay | Admitting: Neurology

## 2022-11-14 DIAGNOSIS — G43711 Chronic migraine without aura, intractable, with status migrainosus: Secondary | ICD-10-CM

## 2022-12-12 ENCOUNTER — Other Ambulatory Visit (HOSPITAL_COMMUNITY): Payer: Self-pay

## 2022-12-12 ENCOUNTER — Telehealth: Payer: Self-pay

## 2022-12-12 ENCOUNTER — Telehealth: Payer: Self-pay | Admitting: Neurology

## 2022-12-12 NOTE — Telephone Encounter (Signed)
A new telephone call encounter has been made for this PA Request-please see telephone note dated .Marland KitchenMarland Kitchen7/01/2023.

## 2022-12-12 NOTE — Telephone Encounter (Signed)
Pharmacy Patient Advocate Encounter   Received notification from [employer] that prior authorization for AJOVY (fremanezumab-vfrm) injection 225MG /1.5ML auto-injectors is required/requested.   PA submitted to  [employer]  via [employer] or (Medicaid) confirmation # [id number] Status is pending

## 2022-12-12 NOTE — Telephone Encounter (Signed)
Pt states she reached out to [facility] and was told they are waiting on an approval from her insurance company, Pt has not had the Fremanezumab-vfrm (AJOVY) 225 MG/1.5ML SOAJ , since April.  Please reach out to pt for help.

## 2022-12-13 ENCOUNTER — Other Ambulatory Visit (HOSPITAL_COMMUNITY): Payer: Self-pay

## 2022-12-13 NOTE — Telephone Encounter (Signed)
Pharmacy Patient Advocate Encounter  Prior Authorization for AJOVY (fremanezumab-vfrm) injection 225MG /1.5ML auto-injectors has been APPROVED by  [facility]  from 12/12/2022 to 06/10/2023.   PA #  PA Case ID: [id number]  Copay is $24.98 per [facility] test claim for a 30DS.

## 2023-01-30 ENCOUNTER — Telehealth: Payer: Self-pay | Admitting: Neurology

## 2023-01-30 NOTE — Telephone Encounter (Signed)
Pt said, Saturday gave Fremanezumab-vfrm (AJOVY) 225 MG/1.5ML SOAJ on left side of stomach. Have injection site is red with a rash. Would like a call back.

## 2023-01-31 NOTE — Telephone Encounter (Signed)
Pt is asking for a call back from [place], CMA in response to vm left for her

## 2023-01-31 NOTE — Telephone Encounter (Signed)
I called and spoke with the patient to discuss her rash. She said the area is itchy. I reviewed the photo she sent. I informed the patient this is a side effect of Ajovy but to monitor the area for any changes. She will call back if the area worsens. She verbalized understanding and expressed appreciation for the call. All questions answered.

## 2023-01-31 NOTE — Telephone Encounter (Signed)
I left a voicemail for the patient to give [place] a call back. Will send a mychart message as well.

## 2023-02-16 ENCOUNTER — Encounter: Payer: Self-pay | Admitting: Family Medicine

## 2023-02-16 ENCOUNTER — Ambulatory Visit (INDEPENDENT_AMBULATORY_CARE_PROVIDER_SITE_OTHER): Payer: Commercial Managed Care - PPO | Admitting: Family Medicine

## 2023-02-16 VITALS — BP 136/74 | HR 62 | Temp 98.7°F | Ht 65.0 in | Wt 147.2 lb

## 2023-02-16 DIAGNOSIS — Z0000 Encounter for general adult medical examination without abnormal findings: Secondary | ICD-10-CM

## 2023-02-16 DIAGNOSIS — R7303 Prediabetes: Secondary | ICD-10-CM

## 2023-02-16 DIAGNOSIS — Z8669 Personal history of other diseases of the nervous system and sense organs: Secondary | ICD-10-CM | POA: Diagnosis not present

## 2023-02-16 DIAGNOSIS — Z23 Encounter for immunization: Secondary | ICD-10-CM

## 2023-02-16 DIAGNOSIS — E782 Mixed hyperlipidemia: Secondary | ICD-10-CM | POA: Diagnosis not present

## 2023-02-16 LAB — CBC WITH DIFFERENTIAL/PLATELET
Basophils Absolute: 0 10*3/uL (ref 0.0–0.1)
Basophils Relative: 0.8 % (ref 0.0–3.0)
Eosinophils Absolute: 0.1 10*3/uL (ref 0.0–0.7)
Eosinophils Relative: 1.6 % (ref 0.0–5.0)
HCT: 40.7 % (ref 36.0–46.0)
Hemoglobin: 13.1 g/dL (ref 12.0–15.0)
Lymphocytes Relative: 48.7 % — ABNORMAL HIGH (ref 12.0–46.0)
Lymphs Abs: 1.9 10*3/uL (ref 0.7–4.0)
MCHC: 32.2 g/dL (ref 30.0–36.0)
MCV: 92.5 fl (ref 78.0–100.0)
Monocytes Absolute: 0.3 10*3/uL (ref 0.1–1.0)
Monocytes Relative: 8.9 % (ref 3.0–12.0)
Neutro Abs: 1.6 10*3/uL (ref 1.4–7.7)
Neutrophils Relative %: 40 % — ABNORMAL LOW (ref 43.0–77.0)
Platelets: 238 10*3/uL (ref 150.0–400.0)
RBC: 4.4 Mil/uL (ref 3.87–5.11)
RDW: 13.6 % (ref 11.5–15.5)
WBC: 3.9 10*3/uL — ABNORMAL LOW (ref 4.0–10.5)

## 2023-02-16 LAB — COMPREHENSIVE METABOLIC PANEL
ALT: 12 U/L (ref 0–35)
AST: 16 U/L (ref 0–37)
Albumin: 4 g/dL (ref 3.5–5.2)
Alkaline Phosphatase: 65 U/L (ref 39–117)
BUN: 11 mg/dL (ref 6–23)
CO2: 24 meq/L (ref 19–32)
Calcium: 9.3 mg/dL (ref 8.4–10.5)
Chloride: 108 meq/L (ref 96–112)
Creatinine, Ser: 0.9 mg/dL (ref 0.40–1.20)
GFR: 68.48 mL/min (ref >60.00)
Glucose, Bld: 92 mg/dL (ref 70–99)
Potassium: 3.7 meq/L (ref 3.5–5.1)
Sodium: 141 meq/L (ref 135–145)
Total Bilirubin: 0.3 mg/dL (ref 0.2–1.2)
Total Protein: 7.2 g/dL (ref 6.0–8.3)

## 2023-02-16 LAB — LIPID PANEL
Cholesterol: 188 mg/dL (ref 0–200)
HDL: 66.1 mg/dL (ref >39.00)
LDL Cholesterol: 99 mg/dL (ref 0–99)
NonHDL: 121.96
Total CHOL/HDL Ratio: 3
Triglycerides: 114 mg/dL (ref 0.0–149.0)
VLDL: 22.8 mg/dL (ref 0.0–40.0)

## 2023-02-16 LAB — TSH: TSH: 0.42 u[IU]/mL (ref 0.35–5.50)

## 2023-02-16 LAB — T4, FREE: Free T4: 0.84 ng/dL (ref 0.60–1.60)

## 2023-02-16 LAB — HEMOGLOBIN A1C: Hgb A1c MFr Bld: 5.9 % (ref 4.6–6.5)

## 2023-02-16 MED ORDER — TRAMADOL HCL 50 MG PO TABS
ORAL_TABLET | ORAL | 3 refills | Status: DC
Start: 2023-02-16 — End: 2023-09-04

## 2023-02-16 NOTE — Progress Notes (Signed)
Established Patient Office Visit   Subjective  Patient ID: Evelyn Edwards, female    DOB: August 11, 1960  Age: 62 y.o. MRN: [id number]  Chief Complaint  Patient presents with   Annual Exam    Patient is a 62 year old female seen for CPE and follow-up on chronic conditions.  Since her pt has been working part-time and still caring for her mother.  Seeing Neuro for migraines.  Requesting refill on Tramadol.  Using sparingly.  Started Ajovy a few months ago, not sure its helping.  Last month ajovy injection in L abd caused a pruritic rash.     Past Medical History:  Diagnosis Date   Anxiety    Headache(784.0)    Past Surgical History:  Procedure Laterality Date   ABDOMINAL HYSTERECTOMY  2003   ovaries were left intact   WRIST SURGERY     Social History   Tobacco Use   Smoking status: Never   Smokeless tobacco: Never  Vaping Use   Vaping status: Never Used  Substance Use Topics   Alcohol use: No   Drug use: No   Family History  Problem Relation Age of Onset   Asthma Mother    Hypertension Father    Diabetes Father    Coronary artery disease Father    Colon polyps Father    Prostate cancer Father    Lupus Sister    Migraines Sister    Anxiety disorder Other    Colon cancer Neg Hx    Esophageal cancer Neg Hx    Stomach cancer Neg Hx    Rectal cancer Neg Hx    Allergies  Allergen Reactions   Amoxicillin Hives and Other (See Comments)    Dizziness, fever tmax 101 F, erythema of lips and tongue, hives, sore throat, chills.   Penicillins       ROS Negative unless stated above    Objective:     BP 136/74 (BP Location: Left Arm, Patient Position: Sitting, Cuff Size: Normal)   Pulse 62   Temp 98.7 F (37.1 C) (Oral)   Ht 5\' 5"  (1.651 m)   Wt 147 lb 3.2 oz (66.8 kg)   SpO2 95%   BMI 24.50 kg/m  BP Readings from Last 3 Encounters:  02/16/23 136/74  10/01/22 136/70  09/27/22 124/68   Wt Readings from Last 3 Encounters:  02/16/23 147 lb 3.2 oz (66.8  kg)  09/27/22 147 lb 12.8 oz (67 kg)  08/24/22 145 lb 12.8 oz (66.1 kg)      Physical Exam Constitutional:      Appearance: Normal appearance.  HENT:     Head: Normocephalic and atraumatic.     Right Ear: Tympanic membrane, ear canal and external ear normal.     Left Ear: Tympanic membrane, ear canal and external ear normal.     Nose: Nose normal.     Mouth/Throat:     Mouth: Mucous membranes are moist.     Pharynx: No oropharyngeal exudate or posterior oropharyngeal erythema.  Eyes:     General: No scleral icterus.    Extraocular Movements: Extraocular movements intact.     Conjunctiva/sclera: Conjunctivae normal.     Pupils: Pupils are equal, round, and reactive to light.  Neck:     Thyroid: No thyromegaly.  Cardiovascular:     Rate and Rhythm: Normal rate and regular rhythm.     Pulses: Normal pulses.     Heart sounds: Normal heart sounds. No murmur heard.    No friction  rub.  Pulmonary:     Effort: Pulmonary effort is normal.     Breath sounds: Normal breath sounds. No wheezing, rhonchi or rales.  Abdominal:     General: Bowel sounds are normal.     Palpations: Abdomen is soft.     Tenderness: There is no abdominal tenderness.  Musculoskeletal:        General: No deformity. Normal range of motion.  Lymphadenopathy:     Cervical: No cervical adenopathy.  Skin:    General: Skin is warm and dry.     Findings: No lesion.  Neurological:     General: No focal deficit present.     Mental Status: She is alert and oriented to person, place, and time.  Psychiatric:        Mood and Affect: Mood normal.        Thought Content: Thought content normal.     No results found for any visits on 02/16/23.    Assessment & Plan:  Well adult exam -     TSH -     T4, free -     Hemoglobin A1c  Need for influenza vaccination -     Flu vaccine trivalent PF, 6mos and older(Flulaval,Afluria,Fluarix,Fluzone)  History of migraine -     traMADol HCl; TAKE 1 TABLET(50 MG) BY  MOUTH EVERY 8 HOURS AS NEEDED  Dispense: 60 tablet; Refill: 3 -     CBC with Differential/Platelet  Mixed hyperlipidemia -     Comprehensive metabolic panel -     Lipid panel  Prediabetes -     Hemoglobin A1c  Age-appropriate health screenings discussed.  Labs ordered.  Immunizations reviewed.  Informed vaccine given this visit.  Colonoscopy up-to-date done 03/01/2022.  Mammogram due, last done 12/09/2021.  Bone density scan not yet indicated.  Lifestyle modifications.  No follow-ups on file.   Deeann Saint, MD

## 2023-04-17 NOTE — Progress Notes (Unsigned)
Patient: Evelyn Edwards Date of Birth: Jun 01, 1961  Reason for Visit: Follow up History from: Patient Primary Neurologist: Lucia Gaskins   ASSESSMENT AND PLAN 62 y.o. year old female   1.  Chronic migraine headaches  -Doing great, much better with Ajovy!! -Continue Ajovy 225 mg monthly injection for migraine prevention, will send prescription as 47-month supply since pharmacy having issue with stock -Continue Topamax 100 mg twice daily -For acute headache, continue Amerge 2.5 mg, may combine with Zofran, also has tramadol from PCP if severe -Follow-up with me in 1 year or sooner if needed  Meds ordered this encounter  Medications   topiramate (TOPAMAX) 100 MG tablet    Sig: Take 1 tablet (100 mg total) by mouth 2 (two) times daily.    Dispense:  180 tablet    Refill:  4   ondansetron (ZOFRAN-ODT) 4 MG disintegrating tablet    Sig: Take 1-2 tablets (4-8 mg total) by mouth every 8 (eight) hours as needed.    Dispense:  30 tablet    Refill:  3   naratriptan (AMERGE) 2.5 MG tablet    Sig: Take 1 tablet (2.5 mg total) by mouth as needed for migraine. Take one (1) tablet at onset of headache; if returns or does not resolve, may repeat after 4 hours; do not exceed five (5) mg in 24 hours.    Dispense:  10 tablet    Refill:  11   Fremanezumab-vfrm (AJOVY) 225 MG/1.5ML SOAJ    Sig: Inject 225 mg into the skin every 30 (thirty) days. PLEASE USE COPAY CARD: BIN#: [id number] PCN#: PDMI GROUP#: [id number] ID#: [id number] EXPIRES: 06/06/2023    Dispense:  4.5 mL    Refill:  3    PLEASE USE COPAY CARD: BIN#: [id number] PCN#: PDMI GROUP#: [id number] ID#: [id number] EXPIRES: 06/06/2023 Can you fill for 3 month supply? Patient reports has been having to get late due to stock issue   HISTORY OF PRESENT ILLNESS: Today 04/18/23 Doing very well.  Remains on Ajovy. Migraines much less, only 3 a month. Can still be severe, with light sensitivity, have to lay in dark room. For acute migraine will take Amerge,  Zofran, will use Tramadol if needed. Tramadol is given by PCP for severe migraines. On Topamax 100 mg BID. Before Ajovy was having 3-4 migraines a week!! Stress, fatigue is a trigger. Did early retirement  [employer], works part time right now, 20 hours at [employer] right now. 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  Is very pleased with migraine control.  Only issue is Ajovy sometimes not being in stock at pharmacy.  HISTORY  September 27, 2022: Evelyn Edwards is a 63 year old female who we saw in 2021 for migraines, reviewed chart: at the time she was uninsured to try to help her get: Financial assistance we did not see her again she was referred back for migraines last July 2023 by Dr. Salomon Fick.  I reviewed Dr. Salomon Fick notes from 12/23/2021 close to 10 months ago and at the time she had shown up for a comprehensive physical exam, at that time she will take tramadol sparingly for headaches, diagnosed with migraine but she claims previous primary care diagnosed with cluster headaches, they referred back to [place].  She was seen again by Dr. Salomon Fick in March 2024 but no mention of migraines at that time her headaches.   At least 8 migraine days a month and > 15 headaches days a month. Pulsating/pounding/throbbing,nausea,photo/phonophobia, nausea, they can last 24 hours and can be severe. No  changes in quality or severity or frequency since last being seen, no vision changes, nomorning headaches, not exertional, no aura, No other focal neurologic deficits, associated symptoms, inciting events or modifiable factobrs.   From a thorough review of records, Medications tried include : Topiramate, amerge(helped acutely), nurtec, fleril, zofran, rizatriptan, sumatriptan, celexa, nadolol, zomig, amitriptyline  REVIEW OF SYSTEMS: Out of a complete 14 system review of symptoms, the patient complains only of the following symptoms, and all other reviewed systems are negative.  See HPI  ALLERGIES: Allergies  Allergen Reactions   Amoxicillin Hives  and Other (See Comments)    Dizziness, fever tmax 101 F, erythema of lips and tongue, hives, sore throat, chills.   Penicillins     HOME MEDICATIONS: Outpatient Medications Prior to Visit  Medication Sig Dispense Refill   cyclobenzaprine (FLEXERIL) 5 MG tablet Take 1 tablet (5 mg total) by mouth at bedtime as needed for muscle spasms. 30 tablet 0   Fremanezumab-vfrm (AJOVY) 225 MG/1.5ML SOAJ Inject 225 mg into the skin every 30 (thirty) days. PLEASE USE COPAY CARD: BIN#: [id number] PCN#: PDMI GROUP#: [id number] ID#: [id number] EXPIRES: 06/06/2023 1.5 mL 11   Multiple Vitamin (MULTIVITAMIN PO) Take by mouth.     naratriptan (AMERGE) 2.5 MG tablet TAKE 1 TABLET BY MOUTH AS NEEDED AT ONSET OF MIGRAINE, IF RETURNS OR NOT RESOLVED MAY REPEAT AFTER 2 HOURS, DO NOT EXCED 2 IN 24 HOURS 10 tablet 3   ondansetron (ZOFRAN-ODT) 4 MG disintegrating tablet Take 1-2 tablets (4-8 mg total) by mouth every 8 (eight) hours as needed. 30 tablet 3   topiramate (TOPAMAX) 100 MG tablet Take 1 tablet (100 mg total) by mouth 2 (two) times daily. 180 tablet 4   traMADol (ULTRAM) 50 MG tablet TAKE 1 TABLET(50 MG) BY MOUTH EVERY 8 HOURS AS NEEDED 60 tablet 3   Facility-Administered Medications Prior to Visit  Medication Dose Route Frequency Provider Last Rate Last Admin   Fremanezumab-vfrm SOSY 225 mg  225 mg Subcutaneous Once Anson Fret, MD        PAST MEDICAL HISTORY: Past Medical History:  Diagnosis Date   Anxiety    Headache(784.0)     PAST SURGICAL HISTORY: Past Surgical History:  Procedure Laterality Date   ABDOMINAL HYSTERECTOMY  2003   ovaries were left intact   WRIST SURGERY      FAMILY HISTORY: Family History  Problem Relation Age of Onset   Asthma Mother    Hypertension Father    Diabetes Father    Coronary artery disease Father    Colon polyps Father    Prostate cancer Father    Lupus Sister    Migraines Sister    Anxiety disorder Other    Colon cancer Neg Hx    Esophageal cancer Neg  Hx    Stomach cancer Neg Hx    Rectal cancer Neg Hx     SOCIAL HISTORY: Social History   Socioeconomic History   Marital status: Single    Spouse name: Not on file   Number of children: 1   Years of education: Not on file   Highest education level: 12th grade  Occupational History   Occupation: Retail  Tobacco Use   Smoking status: Never   Smokeless tobacco: Never  Vaping Use   Vaping status: Never Used  Substance and Sexual Activity   Alcohol use: No   Drug use: No   Sexual activity: Not on file  Other Topics Concern   Not on file  Social  History Narrative   Not on file   Social Determinants of Health   Financial Resource Strain: Low Risk  (02/13/2023)   Overall Financial Resource Strain (CARDIA)    Difficulty of Paying Living Expenses: Not very hard  Food Insecurity: No Food Insecurity (02/13/2023)   Hunger Vital Sign    Worried About Running Out of Food in the Last Year: Never true    Ran Out of Food in the Last Year: Never true  Transportation Needs: No Transportation Needs (02/13/2023)   PRAPARE - Administrator, Civil Service (Medical): No    Lack of Transportation (Non-Medical): No  Physical Activity: Insufficiently Active (02/13/2023)   Exercise Vital Sign    Days of Exercise per Week: 3 days    Minutes of Exercise per Session: 30 min  Stress: No Stress Concern Present (02/13/2023)   [employer] of Occupational Health - Occupational Stress Questionnaire    Feeling of Stress : Not at all  Social Connections: Moderately Isolated (02/13/2023)   Social Connection and Isolation Panel [NHANES]    Frequency of Communication with Friends and Family: More than three times a week    Frequency of Social Gatherings with Friends and Family: More than three times a week    Attends Religious Services: More than 4 times per year    Active Member of [employer] or Organizations: No    Attends Banker Meetings: Not on file    Marital Status: Divorced   Intimate Partner Violence: Not on file    PHYSICAL EXAM  Vitals:   04/18/23 0925  BP: 134/72  Pulse: 68  Weight: 150 lb (68 kg)  Height: 5' 5.5" (1.664 m)   Body mass index is 24.58 kg/m.  Generalized: Well developed, in no acute distress  Neurological examination  Mentation: Alert oriented to time, place, history taking. Follows all commands speech and language fluent Cranial nerve II-XII: Pupils were equal round reactive to light. Extraocular movements were full, visual field were full on confrontational test. Facial sensation and strength were normal.  Head turning and shoulder shrug  were normal and symmetric. Motor: The motor testing reveals 5 over 5 strength of all 4 extremities. Good symmetric motor tone is noted throughout.  Sensory: Sensory testing is intact to soft touch on all 4 extremities. No evidence of extinction is noted.  Coordination: Cerebellar testing reveals good finger-nose-finger and heel-to-shin bilaterally.  Gait and station: Gait is normal.  DIAGNOSTIC DATA (LABS, IMAGING, TESTING) - I reviewed patient records, labs, notes, testing and imaging myself where available.  Lab Results  Component Value Date   WBC 3.9 (L) 02/16/2023   HGB 13.1 02/16/2023   HCT 40.7 02/16/2023   MCV 92.5 02/16/2023   PLT 238.0 02/16/2023      Component Value Date/Time   NA 141 02/16/2023 1156   K 3.7 02/16/2023 1156   CL 108 02/16/2023 1156   CO2 24 02/16/2023 1156   GLUCOSE 92 02/16/2023 1156   BUN 11 02/16/2023 1156   CREATININE 0.90 02/16/2023 1156   CALCIUM 9.3 02/16/2023 1156   PROT 7.2 02/16/2023 1156   ALBUMIN 4.0 02/16/2023 1156   AST 16 02/16/2023 1156   ALT 12 02/16/2023 1156   ALKPHOS 65 02/16/2023 1156   BILITOT 0.3 02/16/2023 1156   GFRNONAA 114.46 05/11/2009 1706   GFRAA 115 10/26/2007 1017   Lab Results  Component Value Date   CHOL 188 02/16/2023   HDL 66.10 02/16/2023   LDLCALC 99 02/16/2023  TRIG 114.0 02/16/2023   CHOLHDL 3 02/16/2023    Lab Results  Component Value Date   HGBA1C 5.9 02/16/2023   Lab Results  Component Value Date   VITAMINB12 662 10/01/2022   Lab Results  Component Value Date   TSH 0.42 02/16/2023    Margie Ege, AGNP-C, DNP 04/18/2023, 9:52 AM [facility] [address], [address] [place], [place] [address] [phone]

## 2023-04-18 ENCOUNTER — Encounter: Payer: Self-pay | Admitting: Neurology

## 2023-04-18 ENCOUNTER — Ambulatory Visit: Payer: Commercial Managed Care - PPO | Admitting: Neurology

## 2023-04-18 DIAGNOSIS — G43711 Chronic migraine without aura, intractable, with status migrainosus: Secondary | ICD-10-CM

## 2023-04-18 MED ORDER — AJOVY 225 MG/1.5ML ~~LOC~~ SOAJ
225.0000 mg | SUBCUTANEOUS | 3 refills | Status: AC
Start: 2023-04-18 — End: ?

## 2023-04-18 MED ORDER — ONDANSETRON 4 MG PO TBDP: 4.0000 mg | ORAL_TABLET | Freq: Three times a day (TID) | ORAL | 3 refills | Status: DC | PRN

## 2023-04-18 MED ORDER — TOPIRAMATE 100 MG PO TABS: 100.0000 mg | ORAL_TABLET | Freq: Two times a day (BID) | ORAL | 4 refills | Status: DC

## 2023-04-18 MED ORDER — NARATRIPTAN HCL 2.5 MG PO TABS: 2.5000 mg | ORAL_TABLET | ORAL | 11 refills | Status: DC | PRN

## 2023-04-18 NOTE — Patient Instructions (Signed)
Great to meet you!!  I am glad you are doing well!! We will continue current migraine medications.  Please reach out if your migraines increase.  See back in 1 year.  Thanks!!

## 2023-06-16 ENCOUNTER — Telehealth: Payer: Self-pay | Admitting: Neurology

## 2023-06-16 NOTE — Telephone Encounter (Signed)
 Pt called stating that her insurance is requesting a PA for her Fremanezumab-vfrm (AJOVY) 225 MG/1.5ML SOAJ  Please advise.

## 2023-06-19 ENCOUNTER — Telehealth: Payer: Self-pay

## 2023-06-19 ENCOUNTER — Other Ambulatory Visit (HOSPITAL_COMMUNITY): Payer: Self-pay

## 2023-06-19 NOTE — Telephone Encounter (Signed)
 Pharmacy Patient Advocate Encounter   Received notification from Physician's Office that prior authorization for AJOVY  (fremanezumab -vfrm) injection 225MG /1.5ML auto-injectors is required/requested.   Insurance verification completed.   The patient is insured through [employer] .   Per test claim: PA required; PA submitted to above mentioned insurance via CoverMyMeds Key/confirmation #/EOC [id number] Status is pending

## 2023-06-19 NOTE — Telephone Encounter (Signed)
 PA request has been Submitted. New Encounter created for follow up. For additional info see Pharmacy Prior Auth telephone encounter from 06/19/2023.

## 2023-06-20 ENCOUNTER — Other Ambulatory Visit (HOSPITAL_COMMUNITY): Payer: Self-pay

## 2023-06-20 NOTE — Telephone Encounter (Signed)
 Pharmacy Patient Advocate Encounter   Received notification from Physician's Office that prior authorization for AJOVY  (fremanezumab -vfrm) injection 225MG /1.5ML auto-injectors is required/requested.   Insurance verification completed.   The patient is insured through [employer] .   Per test claim: PA required; PA submitted to above mentioned insurance via CoverMyMeds Key/confirmation #/EOC [employer] Status is pending  Had to resubmit for 28DS due to insurance rejecting the 84DS previous PA that was submitted.

## 2023-06-20 NOTE — Telephone Encounter (Signed)
 Pharmacy Patient Advocate Encounter  Received notification from [facility] that Prior Authorization for AJOVY  (fremanezumab -vfrm) injection 225MG /1.5ML auto-injectors has been APPROVED from 06/20/2023 to 06/19/2024. Ran test claim, Copay is $24.98. This test claim was processed through [facility]- copay amounts may vary at other pharmacies due to pharmacy/plan contracts, or as the patient moves through the different stages of their insurance plan.   PA #/Case ID/Reference #: PA Case ID #: [id number]

## 2023-06-23 ENCOUNTER — Ambulatory Visit (INDEPENDENT_AMBULATORY_CARE_PROVIDER_SITE_OTHER): Payer: Commercial Managed Care - PPO

## 2023-06-23 ENCOUNTER — Ambulatory Visit: Payer: Self-pay | Admitting: Family Medicine

## 2023-06-23 ENCOUNTER — Encounter (HOSPITAL_COMMUNITY): Payer: Self-pay

## 2023-06-23 ENCOUNTER — Ambulatory Visit (HOSPITAL_COMMUNITY)
Admission: RE | Admit: 2023-06-23 | Discharge: 2023-06-23 | Disposition: A | Discharge status: Home / Self Care | Payer: Commercial Managed Care - PPO | Source: Ambulatory Visit | Attending: Family Medicine | Admitting: Family Medicine

## 2023-06-23 VITALS — BP 144/73 | HR 60 | Temp 98.4°F | Resp 18

## 2023-06-23 DIAGNOSIS — M545 Low back pain, unspecified: Secondary | ICD-10-CM | POA: Diagnosis not present

## 2023-06-23 MED ORDER — KETOROLAC TROMETHAMINE 10 MG PO TABS: 10.0000 mg | ORAL_TABLET | Freq: Four times a day (QID) | ORAL | 0 refills | Status: AC | PRN

## 2023-06-23 MED ORDER — KETOROLAC TROMETHAMINE 30 MG/ML IJ SOLN
30.0000 mg | Freq: Once | INTRAMUSCULAR | Status: AC
  Administered 2023-06-23: 30 mg via INTRAMUSCULAR

## 2023-06-23 MED ORDER — KETOROLAC TROMETHAMINE 30 MG/ML IJ SOLN
INTRAMUSCULAR | Status: AC
  Filled 2023-06-23: qty 1

## 2023-06-23 MED ORDER — TIZANIDINE HCL 4 MG PO TABS: 4.0000 mg | ORAL_TABLET | Freq: Three times a day (TID) | ORAL | 0 refills | Status: DC | PRN

## 2023-06-23 NOTE — Telephone Encounter (Signed)
Patient has an UC appt 06/23/23 At 2pm

## 2023-06-23 NOTE — ED Provider Notes (Signed)
[facility]    CSN: [id number] Arrival date & time: 06/23/23  1344      History   Chief Complaint Chief Complaint  Patient presents with   Back Pain    Entered by patient    HPI Evelyn Edwards is a 63 y.o. female.    Back Pain Here for low back pain that started suddenly yesterday.  No trauma or fall and did not occur exactly when she was lifting something heavy.  It hurts in her LS area in the midline.  No radiation to the legs.  No bowel or bladder incontinence.  No fever or chills or dysuria or hematuria.  No rash  She is allergic to penicillin  Last EGFR was normal at 68  Past Medical History:  Diagnosis Date   Anxiety    Headache(784.0)     Patient Active Problem List   Diagnosis Date Noted   Chronic migraine without aura, with intractable migraine, so stated, with status migrainosus 09/27/2022   Acute bilateral low back pain 04/04/2020   Migraine without aura and without status migrainosus, not intractable 01/22/2020   Weight loss 07/04/2017   Benign paroxysmal positional vertigo 10/29/2014   BREAST MASS, RIGHT 05/28/2009   Headache 10/31/2007    Past Surgical History:  Procedure Laterality Date   ABDOMINAL HYSTERECTOMY  2003   ovaries were left intact   WRIST SURGERY      OB History   No obstetric history on file.      Home Medications    Prior to Admission medications   Medication Sig Start Date End Date Taking? Authorizing Provider  ketorolac (TORADOL) 10 MG tablet Take 1 tablet (10 mg total) by mouth every 6 (six) hours as needed (pain). 06/23/23  Yes Zenia Resides, MD  Multiple Vitamin (MULTIVITAMIN PO) Take by mouth.   Yes [provider]  tiZANidine (ZANAFLEX) 4 MG tablet Take 1 tablet (4 mg total) by mouth every 8 (eight) hours as needed for muscle spasms. 06/23/23  Yes Zenia Resides, MD  topiramate (TOPAMAX) 100 MG tablet Take 1 tablet (100 mg total) by mouth 2 (two) times daily. 04/18/23  Yes Glean Salvo, NP  traMADol (ULTRAM) 50 MG tablet TAKE 1 TABLET(50 MG) BY MOUTH EVERY 8 HOURS AS NEEDED 02/16/23  Yes Deeann Saint, MD  Fremanezumab-vfrm (AJOVY) 225 MG/1.5ML SOAJ Inject 225 mg into the skin every 30 (thirty) days. PLEASE USE COPAY CARD: BIN#: [id number] PCN#: PDMI GROUP#: [id number] ID#: [id number] EXPIRES: 06/06/2023 04/18/23   Glean Salvo, NP  naratriptan (AMERGE) 2.5 MG tablet Take 1 tablet (2.5 mg total) by mouth as needed for migraine. Take one (1) tablet at onset of headache; if returns or does not resolve, may repeat after 4 hours; do not exceed five (5) mg in 24 hours. 04/18/23   Glean Salvo, NP  ondansetron (ZOFRAN-ODT) 4 MG disintegrating tablet Take 1-2 tablets (4-8 mg total) by mouth every 8 (eight) hours as needed. 04/18/23   Glean Salvo, NP    Family History Family History  Problem Relation Age of Onset   Asthma Mother    Hypertension Father    Diabetes Father    Coronary artery disease Father    Colon polyps Father    Prostate cancer Father    Lupus Sister    Migraines Sister    Anxiety disorder Other    Colon cancer Neg Hx    Esophageal cancer Neg Hx    Stomach cancer  Neg Hx    Rectal cancer Neg Hx     Social History Social History   Tobacco Use   Smoking status: Never   Smokeless tobacco: Never  Vaping Use   Vaping status: Never Used  Substance Use Topics   Alcohol use: No   Drug use: No     Allergies   Amoxicillin and Penicillins   Review of Systems Review of Systems  Musculoskeletal:  Positive for back pain.     Physical Exam Triage Vital Signs ED Triage Vitals  Encounter Vitals Group     BP 06/23/23 1425 (!) 144/73     Systolic BP Percentile --      Diastolic BP Percentile --      Pulse Rate 06/23/23 1425 60     Resp 06/23/23 1425 18     Temp 06/23/23 1425 98.4 F (36.9 C)     Temp Source 06/23/23 1425 Oral     SpO2 06/23/23 1425 98 %     Weight --      Height --      Head Circumference --      Peak Flow --       Pain Score 06/23/23 1423 8     Pain Loc --      Pain Education --      Exclude from Growth Chart --    No data found.  Updated Vital Signs BP (!) 144/73 (BP Location: Right Arm)   Pulse 60   Temp 98.4 F (36.9 C) (Oral)   Resp 18   SpO2 98%   Visual Acuity Right Eye Distance:   Left Eye Distance:   Bilateral Distance:    Right Eye Near:   Left Eye Near:    Bilateral Near:     Physical Exam Vitals reviewed.  Constitutional:      General: She is not in acute distress.    Appearance: She is not toxic-appearing.  HENT:     Mouth/Throat:     Mouth: Mucous membranes are moist.     Pharynx: No oropharyngeal exudate or posterior oropharyngeal erythema.  Eyes:     Extraocular Movements: Extraocular movements intact.     Pupils: Pupils are equal, round, and reactive to light.  Cardiovascular:     Rate and Rhythm: Normal rate and regular rhythm.     Heart sounds: No murmur heard. Pulmonary:     Effort: Pulmonary effort is normal. No respiratory distress.     Breath sounds: No stridor. No wheezing, rhonchi or rales.  Musculoskeletal:     Cervical back: Neck supple.     Comments: There is mild tenderness over the midline in the LS area  Lymphadenopathy:     Cervical: No cervical adenopathy.  Skin:    Capillary Refill: Capillary refill takes less than 2 seconds.     Coloration: Skin is not jaundiced or pale.  Neurological:     General: No focal deficit present.     Mental Status: She is alert and oriented to person, place, and time.  Psychiatric:        Behavior: Behavior normal.      UC Treatments / Results  Labs (all labs ordered are listed, but only abnormal results are displayed) Labs Reviewed - No data to display  EKG   Radiology DG Lumbar Spine 2-3 Views Result Date: 06/23/2023 CLINICAL DATA:  Low back pain.  Twisting injury yesterday. EXAM: LUMBAR SPINE - 2-3 VIEW COMPARISON:  Lumbar spine radiographs 08/24/2022 FINDINGS: There are  5 non-rib-bearing  lumbar type vertebrae. Grade 1 anterolisthesis of L3 on L4 is unchanged. Mild multilevel spondylosis is similar to the prior radiographs. No acute fracture is identified. IMPRESSION: No acute osseous abnormality identified. Electronically Signed   By: Sebastian Ache M.D.   On: 06/23/2023 16:05    Procedures Procedures (including critical care time)  Medications Ordered in UC Medications  ketorolac (TORADOL) 30 MG/ML injection 30 mg (30 mg Intramuscular Given 06/23/23 1530)    Initial Impression / Assessment and Plan / UC Course  I have reviewed the triage vital signs and the nursing notes.  Pertinent labs & imaging results that were available during my care of the patient were reviewed by me and considered in my medical decision making (see chart for details).     L-spine x-rays are benign without any compression fracture.   Toradol injections given here and Toradol tablets are sent to the pharmacy.  If you tizanidine are sent in for nighttime use. Final Clinical Impressions(s) / UC Diagnoses   Final diagnoses:  Acute midline low back pain without sciatica     Discharge Instructions      Your x-rays did not show any compression fractures.  You have been given a shot of Toradol 30 mg today.  Ketorolac 10 mg tablets--take 1 tablet every 6 hours as needed for pain.  This is the same medicine that is in the shot we just gave you   Take tizanidine 4 mg--1 every 8 hours as needed for muscle spasms; this medication can cause dizziness and sleepiness      ED Prescriptions     Medication Sig Dispense Auth. Provider   ketorolac (TORADOL) 10 MG tablet Take 1 tablet (10 mg total) by mouth every 6 (six) hours as needed (pain). 20 tablet Leeann Bady, Janace Aris, MD   tiZANidine (ZANAFLEX) 4 MG tablet Take 1 tablet (4 mg total) by mouth every 8 (eight) hours as needed for muscle spasms. 15 tablet Janautica Netzley, Janace Aris, MD      PDMP not reviewed this encounter.   Zenia Resides,  MD 06/23/23 [phone]

## 2023-06-23 NOTE — Telephone Encounter (Signed)
Copied from CRM [id number]. Topic: Clinical - Red Word Triage >> Jun 23, 2023  8:26 AM Desma Mcgregor wrote: Red Word that prompted transfer to Nurse Triage: Extreme lower back pain, started yesterday. Doesn't matter which way she moves, gets up sits down laying down. Pain 10/10    Chief Complaint: 10/10 back pain after lifting mother Symptoms: Back pain all over Frequency: constant Pertinent Negatives: Patient denies fever or abd pain Disposition: [x] ED /[] Urgent Care (no appt availability in office) / [] Appointment(In office/virtual)/ []  [facility]/ [] Home Care/ [] Refused Recommended Disposition /[] [facility]/ []  Follow-up with PCP Additional Notes: Pt reports 10/10 back pain after lifting mother yesterday, states nothing helps, has taken Ibuprofen with no relief. States that it hurts to sit or stand. Advised patient to go to ED, pt states she "may" go to [facility]  Reason for Disposition  [1] SEVERE back pain (e.g., excruciating) AND [2] sudden onset AND [3] age > 60 years  Answer Assessment - Initial Assessment Questions 1. ONSET: "When did the pain begin?"      Started yesterday  2. LOCATION: "Where does it hurt?" (upper, mid or lower back)     All over back  3. SEVERITY: "How bad is the pain?"  (e.g., Scale 1-10; mild, moderate, or severe)   - MILD (1-3): Doesn't interfere with normal activities.    - MODERATE (4-7): Interferes with normal activities or awakens from sleep.    - SEVERE (8-10): Excruciating pain, unable to do any normal activities.      10/10 pain  4. PATTERN: "Is the pain constant?" (e.g., yes, no; constant, intermittent)      Constant  5. RADIATION: "Does the pain shoot into your legs or somewhere else?"     Non- radiating  6. CAUSE:  "What do you think is causing the back pain?"      Started after trying to assist mother into her wheelchair  7. BACK OVERUSE:  "Any recent lifting of heavy objects, strenuous work or exercise?"     Recent  lifting  8. MEDICINES: "What have you taken so far for the pain?" (e.g., nothing, acetaminophen, NSAIDS)     Ibuprofen  9. NEUROLOGIC SYMPTOMS: "Do you have any weakness, numbness, or problems with bowel/bladder control?"     No  10. OTHER SYMPTOMS: "Do you have any other symptoms?" (e.g., fever, abdomen pain, burning with urination, blood in urine)       No other symptoms  Protocols used: Back Pain-A-AH

## 2023-06-23 NOTE — Discharge Instructions (Signed)
Your x-rays did not show any compression fractures.  You have been given a shot of Toradol 30 mg today.  Ketorolac 10 mg tablets--take 1 tablet every 6 hours as needed for pain.  This is the same medicine that is in the shot we just gave you   Take tizanidine 4 mg--1 every 8 hours as needed for muscle spasms; this medication can cause dizziness and sleepiness

## 2023-06-23 NOTE — ED Triage Notes (Signed)
Pt states she twisted her back yesterday. She has lower back pain, she is using heat and IBU without relief. She also has a rx for tramadol

## 2023-07-19 ENCOUNTER — Ambulatory Visit: Payer: Self-pay | Admitting: Family Medicine

## 2023-07-19 NOTE — Telephone Encounter (Signed)
Copied from CRM [id number]. Topic: Clinical - Red Word Triage >> Jul 19, 2023  5:19 PM Evelyn Edwards wrote: [facility] that prompted transfer to Nurse Triage: Patient is having alot of back pain(hurts really bad)  and has been taking pain medication but it isnt helping. She thinks she turned the wrong way. Patient states that she doesnt want to go to urgent care.   Chief Complaint: Back pain  Symptoms: Lower back pain  Frequency: Constant  Pertinent Negatives: Patient denies any weakness, numbness, or loss of bowel or bladder control  Disposition: [] ED /[] Urgent Care (no appt availability in office) / [x] Appointment(In office/virtual)/ []  [facility]/ [] Home Care/ [] Refused Recommended Disposition /[] [facility]/ []  Follow-up with PCP Additional Notes: Patient states that she began to experience lower back pain today. She states her pain has been constant and rates it as 8/10. She states she has taken Ibuprofen, Tramadol, and has been using a heating pad with some relief. She states it may have been caused when she believes she turned her back improperly yesterday. Appointment made for patient tomorrow morning for evaluation.     Reason for Disposition  [1] Age > 50 AND [2] no history of prior similar back pain  Answer Assessment - Initial Assessment Questions 1. ONSET: "When did the pain begin?"      Today  2. LOCATION: "Where does it hurt?" (upper, mid or lower back)     Lower back  3. SEVERITY: "How bad is the pain?"  (e.g., Scale 1-10; mild, moderate, or severe)   - MILD (1-3): Doesn't interfere with normal activities.    - MODERATE (4-7): Interferes with normal activities or awakens from sleep.    - SEVERE (8-10): Excruciating pain, unable to do any normal activities.      8/10 4. PATTERN: "Is the pain constant?" (e.g., yes, no; constant, intermittent)      Constant  5. RADIATION: "Does the pain shoot into your legs or somewhere else?"     No 6. CAUSE:  "What do  you think is causing the back pain?"      Believes she may have twisted wrong  7. BACK OVERUSE:  "Any recent lifting of heavy objects, strenuous work or exercise?"     No 8. MEDICINES: "What have you taken so far for the pain?" (e.g., nothing, acetaminophen, NSAIDS)     Ibuprofen, Tramadol, with minor relief  9. NEUROLOGIC SYMPTOMS: "Do you have any weakness, numbness, or problems with bowel/bladder control?"     No 10. OTHER SYMPTOMS: "Do you have any other symptoms?" (e.g., fever, abdomen pain, burning with urination, blood in urine)       No 11. PREGNANCY: "Is there any chance you are pregnant?" "When was your last menstrual period?"       No  Protocols used: Back Pain-A-AH

## 2023-07-20 ENCOUNTER — Ambulatory Visit: Payer: Commercial Managed Care - PPO | Admitting: Family Medicine

## 2023-07-20 ENCOUNTER — Encounter: Payer: Self-pay | Admitting: Family Medicine

## 2023-07-20 VITALS — BP 112/68 | HR 68 | Temp 98.9°F | Ht 65.5 in | Wt 150.2 lb

## 2023-07-20 DIAGNOSIS — R42 Dizziness and giddiness: Secondary | ICD-10-CM

## 2023-07-20 DIAGNOSIS — M545 Low back pain, unspecified: Secondary | ICD-10-CM | POA: Diagnosis not present

## 2023-07-20 MED ORDER — TIZANIDINE HCL 4 MG PO TABS: 4.0000 mg | ORAL_TABLET | Freq: Four times a day (QID) | ORAL | 0 refills | Status: DC | PRN

## 2023-07-20 MED ORDER — KETOROLAC TROMETHAMINE 60 MG/2ML IM SOLN
60.0000 mg | Freq: Once | INTRAMUSCULAR | Status: AC
  Administered 2023-07-20: 60 mg via INTRAMUSCULAR

## 2023-07-20 NOTE — Progress Notes (Signed)
Established Patient Office Visit   Subjective  Patient ID: Evelyn Edwards, female    DOB: [birth year]  Age: 63 y.o. MRN: [id number]  Chief Complaint  Patient presents with   Back Pain    Started 2 days ago, lower back pain, rate of [pain 9 out of 10, aches      Pt is a 63 yo female seen for acute concern.  Pt states she had an episode of vertigo 2 days ago while helping her mother.  Pt then developed acute b/l low back pain.  Pain is a 9/10, constant, "toothache"pain.  Tried ibuprofen and heat.  Has some tramadol at home.  Out of msk rlxr.  Denies fever, chills, n/v, loss of bowel or bladder, paresthesias, or radiculopathy.  Vertigo has since resolved.    Patient Active Problem List   Diagnosis Date Noted   Chronic migraine without aura, with intractable migraine, so stated, with status migrainosus 09/27/2022   Acute bilateral low back pain 04/04/2020   Migraine without aura and without status migrainosus, not intractable 01/22/2020   Weight loss 07/04/2017   Benign paroxysmal positional vertigo 10/29/2014   BREAST MASS, RIGHT 05/28/2009   Headache 10/31/2007   Past Medical History:  Diagnosis Date   Anxiety    Headache(784.0)    Past Surgical History:  Procedure Laterality Date   ABDOMINAL HYSTERECTOMY  2003   ovaries were left intact   WRIST SURGERY     Social History   Tobacco Use   Smoking status: Never   Smokeless tobacco: Never  Vaping Use   Vaping status: Never Used  Substance Use Topics   Alcohol use: No   Drug use: No   Family History  Problem Relation Age of Onset   Asthma Mother    Hypertension Father    Diabetes Father    Coronary artery disease Father    Colon polyps Father    Prostate cancer Father    Lupus Sister    Migraines Sister    Anxiety disorder Other    Colon cancer Neg Hx    Esophageal cancer Neg Hx    Stomach cancer Neg Hx    Rectal cancer Neg Hx    Allergies  Allergen Reactions   Amoxicillin Hives and Other (See  Comments)    Dizziness, fever tmax 101 F, erythema of lips and tongue, hives, sore throat, chills.   Penicillins       ROS Negative unless stated above    Objective:     BP 112/68 (BP Location: Left Arm, Patient Position: Sitting, Cuff Size: Normal)   Pulse 68   Temp 98.9 F (37.2 C) (Oral)   Ht 5' 5.5" (1.664 m)   Wt 150 lb 3.2 oz (68.1 kg)   SpO2 97%   BMI 24.61 kg/m  BP Readings from Last 3 Encounters:  07/20/23 112/68  06/23/23 (!) 144/73  04/18/23 134/72   Wt Readings from Last 3 Encounters:  07/20/23 150 lb 3.2 oz (68.1 kg)  04/18/23 150 lb (68 kg)  02/16/23 147 lb 3.2 oz (66.8 kg)      Physical Exam Constitutional:      General: She is not in acute distress.    Appearance: Normal appearance.  HENT:     Head: Normocephalic and atraumatic.     Nose: Nose normal.     Mouth/Throat:     Mouth: Mucous membranes are moist.  Cardiovascular:     Rate and Rhythm: Regular rhythm.     Heart  sounds: Normal heart sounds. No murmur heard.    No gallop.  Pulmonary:     Effort: Pulmonary effort is normal. No respiratory distress.     Breath sounds: Normal breath sounds. No wheezing, rhonchi or rales.  Musculoskeletal:     Thoracic back: Normal.     Lumbar back: Tenderness present.       Back:  Skin:    General: Skin is warm and dry.  Neurological:     Mental Status: She is alert and oriented to person, place, and time.     No results found for any visits on 07/20/23.    Assessment & Plan:  Acute bilateral low back pain without sciatica -     tiZANidine HCl; Take 1 tablet (4 mg total) by mouth every 6 (six) hours as needed for muscle spasms.  Dispense: 30 tablet; Refill: 0 -     Ketorolac Tromethamine  Vertigo  Patient with acute lumbosacral back pain.  Recent x-ray from 06/23/2023 of lumbar spine reviewed, no acute osseous abnormality identified.  Grade 1 anterior listhesis of L3 on L4 unchanged.  Mild multilevel spondylosis similar to prior radiographs  noted.  Discussed supportive care including stretching, heat, NSAIDs or Tylenol, and topical analgesics.  Zanaflex refilled.  Toradol 60 mg IM given in clinic.  Consider PT.  Vertigo resolved.  Return if symptoms worsen or fail to improve.   Deeann Saint, MD

## 2023-07-20 NOTE — Telephone Encounter (Addendum)
Patient has an appointment with Dr. Salomon Fick 2/13

## 2023-08-30 ENCOUNTER — Other Ambulatory Visit: Payer: Self-pay | Admitting: Family Medicine

## 2023-08-30 DIAGNOSIS — Z8669 Personal history of other diseases of the nervous system and sense organs: Secondary | ICD-10-CM

## 2023-08-30 DIAGNOSIS — M545 Low back pain, unspecified: Secondary | ICD-10-CM

## 2023-10-18 ENCOUNTER — Other Ambulatory Visit: Payer: Self-pay | Admitting: Neurology

## 2023-10-19 ENCOUNTER — Other Ambulatory Visit: Payer: Self-pay

## 2023-10-21 ENCOUNTER — Encounter (HOSPITAL_COMMUNITY): Payer: Self-pay | Admitting: *Deleted

## 2023-10-21 ENCOUNTER — Other Ambulatory Visit: Payer: Self-pay

## 2023-10-21 ENCOUNTER — Emergency Department (HOSPITAL_COMMUNITY)

## 2023-10-21 ENCOUNTER — Emergency Department (HOSPITAL_COMMUNITY)
Admission: EM | Admit: 2023-10-21 | Discharge: 2023-10-21 | Disposition: A | Discharge status: Home / Self Care | Attending: Emergency Medicine | Admitting: Emergency Medicine

## 2023-10-21 DIAGNOSIS — E0590 Thyrotoxicosis, unspecified without thyrotoxic crisis or storm: Secondary | ICD-10-CM | POA: Diagnosis not present

## 2023-10-21 DIAGNOSIS — R002 Palpitations: Secondary | ICD-10-CM | POA: Diagnosis present

## 2023-10-21 LAB — COMPREHENSIVE METABOLIC PANEL WITH GFR
ALT: 13 U/L (ref 0–44)
AST: 19 U/L (ref 15–41)
Albumin: 3.8 g/dL (ref 3.5–5.0)
Alkaline Phosphatase: 67 U/L (ref 38–126)
Anion gap: 11 (ref 5–15)
BUN: 15 mg/dL (ref 8–23)
CO2: 22 mmol/L (ref 22–32)
Calcium: 9.7 mg/dL (ref 8.9–10.3)
Chloride: 106 mmol/L (ref 98–111)
Creatinine, Ser: 1.02 mg/dL — ABNORMAL HIGH (ref 0.44–1.00)
GFR, Estimated: >60 mL/min (ref >60)
Glucose, Bld: 100 mg/dL — ABNORMAL HIGH (ref 70–99)
Potassium: 3.9 mmol/L (ref 3.5–5.1)
Sodium: 139 mmol/L (ref 135–145)
Total Bilirubin: 0.6 mg/dL (ref 0.0–1.2)
Total Protein: 7.4 g/dL (ref 6.5–8.1)

## 2023-10-21 LAB — URINALYSIS, ROUTINE W REFLEX MICROSCOPIC
Bilirubin Urine: NEGATIVE
Glucose, UA: NEGATIVE mg/dL
Hgb urine dipstick: NEGATIVE
Ketones, ur: NEGATIVE mg/dL
Leukocytes,Ua: NEGATIVE
Nitrite: NEGATIVE
Protein, ur: NEGATIVE mg/dL
Specific Gravity, Urine: 1.012 (ref 1.005–1.030)
pH: 7 (ref 5.0–8.0)

## 2023-10-21 LAB — CBC
HCT: 42 % (ref 36.0–46.0)
Hemoglobin: 13.6 g/dL (ref 12.0–15.0)
MCH: 29.4 pg (ref 26.0–34.0)
MCHC: 32.4 g/dL (ref 30.0–36.0)
MCV: 90.9 fL (ref 80.0–100.0)
Platelets: 242 10*3/uL (ref 150–400)
RBC: 4.62 MIL/uL (ref 3.87–5.11)
RDW: 12.8 % (ref 11.5–15.5)
WBC: 5.4 10*3/uL (ref 4.0–10.5)
nRBC: 0 % (ref 0.0–0.2)

## 2023-10-21 LAB — TROPONIN I (HIGH SENSITIVITY)
Troponin I (High Sensitivity): 29 ng/L — ABNORMAL HIGH (ref <18)
Troponin I (High Sensitivity): 28 ng/L — ABNORMAL HIGH (ref <18)

## 2023-10-21 LAB — TSH: TSH: 0.305 u[IU]/mL — ABNORMAL LOW (ref 0.350–4.500)

## 2023-10-21 MED ORDER — SODIUM CHLORIDE 0.9 % IV BOLUS
1000.0000 mL | Freq: Once | INTRAVENOUS | Status: AC
  Administered 2023-10-21: 1000 mL via INTRAVENOUS

## 2023-10-21 NOTE — ED Triage Notes (Signed)
 Approx 1630  the pt began to have a rapid heart  rate and felt like she was going to pass out she also felt hot all over her body   and she was having difficulty breathing  no chest pain  now she has a headache that is new

## 2023-10-21 NOTE — Discharge Instructions (Signed)
 Your thyroid  function is slightly abnormal.  Your TSH is 0.30  You need to follow-up with your primary care doctor regarding this and get more thyroid  function tests sent off  Return to ER if you have worse palpitations or chest pain or shortness of breath

## 2023-10-21 NOTE — ED Provider Notes (Signed)
 [facility] AT [facility] Provider Note   CSN: [id number] Arrival date & time: 10/21/23  2003     History  Chief Complaint  Patient presents with   Tachycardia    Evelyn Edwards is a 63 y.o. female here presenting with tachycardia.  Patient states that she was at work and she just felt lightheaded dizzy.  She took her heart rate was little over 100.  Patient denies any chest pain or shortness of breath.  Patient states that she has lightheaded and dizzy at that time and had a mild headache that resolved.  Patient states that he she works indoors and she is staying hydrated.  She never had any heart problems previously.  The history is provided by the patient.       Home Medications Prior to Admission medications   Medication Sig Start Date End Date Taking? Authorizing Provider  Fremanezumab -vfrm (AJOVY ) 225 MG/1.5ML SOAJ Inject 225 mg into the skin every 30 (thirty) days. PLEASE USE COPAY CARD: BIN#: [id number] PCN#: [id number] GROUP#: [id number] ID#: [id number] EXPIRES: 06/06/2023 04/18/23   Wess Hammed, NP  ketorolac  (TORADOL ) 10 MG tablet Take 1 tablet (10 mg total) by mouth every 6 (six) hours as needed (pain). 06/23/23   Banister, Kimaya K, MD  Multiple Vitamin (MULTIVITAMIN PO) Take by mouth.    [provider]  naratriptan  (AMERGE) 2.5 MG tablet Take 1 tablet (2.5 mg total) by mouth as needed for migraine. Take one (1) tablet at onset of headache; if returns or does not resolve, may repeat after 4 hours; do not exceed five (5) mg in 24 hours. 04/18/23   Wess Hammed, NP  ondansetron  (ZOFRAN -ODT) 4 MG disintegrating tablet Take 1-2 tablets (4-8 mg total) by mouth every 8 (eight) hours as needed. 04/18/23   Wess Hammed, NP  tiZANidine  (ZANAFLEX ) 4 MG tablet TAKE 1 TABLET(4 MG) BY MOUTH EVERY 6 HOURS AS NEEDED FOR MUSCLE SPASMS 09/04/23   Viola Greulich, MD  topiramate  (TOPAMAX ) 100 MG tablet TAKE 1 TABLET(100 MG) BY MOUTH TWICE DAILY  10/19/23   Wess Hammed, NP  traMADol  (ULTRAM ) 50 MG tablet TAKE 1 TABLET(50 MG) BY MOUTH EVERY 8 HOURS AS NEEDED 09/04/23   Viola Greulich, MD      Allergies    Amoxicillin  and Penicillins    Review of Systems   Review of Systems  Cardiovascular:  Positive for palpitations.  All other systems reviewed and are negative.   Physical Exam Updated Vital Signs BP (!) 152/73 (BP Location: Right Arm)   Pulse 88   Temp 98.1 F (36.7 C)   Resp (!) 24   Ht 5\' 5"  (1.651 m)   Wt 68.1 kg   SpO2 100%   BMI 24.98 kg/m  Physical Exam Vitals and nursing note reviewed.  Constitutional:      Appearance: Normal appearance.  HENT:     Head: Normocephalic.     Nose: Nose normal.     Mouth/Throat:     Mouth: Mucous membranes are dry.  Eyes:     Extraocular Movements: Extraocular movements intact.     Pupils: Pupils are equal, round, and reactive to light.  Cardiovascular:     Rate and Rhythm: Regular rhythm. Tachycardia present.     Pulses: Normal pulses.     Heart sounds: Normal heart sounds.  Pulmonary:     Effort: Pulmonary effort is normal.     Breath sounds: Normal breath sounds.  Abdominal:  General: Abdomen is flat.     Palpations: Abdomen is soft.  Musculoskeletal:        General: Normal range of motion.     Cervical back: Normal range of motion and neck supple.  Skin:    General: Skin is warm.     Capillary Refill: Capillary refill takes less than 2 seconds.  Neurological:     General: No focal deficit present.     Mental Status: She is alert.  Psychiatric:        Mood and Affect: Mood normal.        Behavior: Behavior normal.     ED Results / Procedures / Treatments   Labs (all labs ordered are listed, but only abnormal results are displayed) Labs Reviewed  COMPREHENSIVE METABOLIC PANEL WITH GFR - Abnormal; Notable for the following components:      Result Value   Glucose, Bld 100 (*)    Creatinine, Ser 1.02 (*)    All other components within normal  limits  URINALYSIS, ROUTINE W REFLEX MICROSCOPIC - Abnormal; Notable for the following components:   APPearance CLOUDY (*)    All other components within normal limits  TROPONIN I (HIGH SENSITIVITY) - Abnormal; Notable for the following components:   Troponin I (High Sensitivity) 29 (*)    All other components within normal limits  CBC  TSH  TROPONIN I (HIGH SENSITIVITY)    EKG EKG Interpretation Date/Time:  Saturday Oct 21 2023 20:15:29 EDT Ventricular Rate:  94 PR Interval:  130 QRS Duration:  88 QT Interval:  364 QTC Calculation: 455 R Axis:   69  Text Interpretation: Normal sinus rhythm Right atrial enlargement Nonspecific ST abnormality Abnormal ECG When compared with ECG of 05-Mar-2008 20:16, PREVIOUS ECG IS PRESENT Confirmed by Florette Hurry [phone]) on 10/21/2023 9:17:39 PM  Radiology DG Chest Port 1 View Result Date: 10/21/2023 CLINICAL DATA:  Shortness of breath EXAM: PORTABLE CHEST 1 VIEW COMPARISON:  07/13/2010 FINDINGS: The heart size and mediastinal contours are within normal limits. Both lungs are clear. The visualized skeletal structures are unremarkable. IMPRESSION: No active disease. Electronically Signed   By: Violeta Grey M.D.   On: 10/21/2023 21:53    Procedures Procedures    Medications Ordered in ED Medications  sodium chloride  0.9 % bolus 1,000 mL (1,000 mLs Intravenous New Bag/Given 10/21/23 2151)    ED Course/ Medical Decision Making/ A&P                                 Medical Decision Making Evelyn Edwards is a 63 y.o. female here presenting with tachycardia.  Patient reported sinus tachycardia at work.  However patient is not tachycardic in the ED.  Consider dehydration versus intermittent A-fib.  Plan to get CBC CMP and troponin x 2 and TSH.  Will give IV fluids and reassess.  11:14 PM Initial Trop was 29 and repeat is 28.  TSH slightly low at 0.35.  I think this can contribute to her palpitations.  Heart rate is in the 70s now.  Patient is  stable for discharge and I recommend that she follows up with her primary care doctor  Problems Addressed: Hyperthyroidism: acute illness or injury Palpitation: acute illness or injury  Amount and/or Complexity of Data Reviewed Labs: ordered. Decision-making details documented in ED Course. Radiology: ordered and independent interpretation performed. Decision-making details documented in ED Course.    Final Clinical Impression(s) /  ED Diagnoses Final diagnoses:  None    Rx / DC Orders ED Discharge Orders     None         Dalene Duck, MD 10/21/23 2315

## 2023-10-23 ENCOUNTER — Ambulatory Visit: Attending: Family Medicine

## 2023-10-23 ENCOUNTER — Encounter: Payer: Self-pay | Admitting: Family Medicine

## 2023-10-23 ENCOUNTER — Ambulatory Visit (INDEPENDENT_AMBULATORY_CARE_PROVIDER_SITE_OTHER): Admitting: Family Medicine

## 2023-10-23 VITALS — BP 118/72 | HR 86 | Temp 98.6°F | Ht 65.0 in | Wt 149.2 lb

## 2023-10-23 DIAGNOSIS — R7989 Other specified abnormal findings of blood chemistry: Secondary | ICD-10-CM

## 2023-10-23 DIAGNOSIS — N179 Acute kidney failure, unspecified: Secondary | ICD-10-CM

## 2023-10-23 DIAGNOSIS — R002 Palpitations: Secondary | ICD-10-CM

## 2023-10-23 LAB — BASIC METABOLIC PANEL WITH GFR
BUN: 14 mg/dL (ref 6–23)
CO2: 23 meq/L (ref 19–32)
Calcium: 9.6 mg/dL (ref 8.4–10.5)
Chloride: 108 meq/L (ref 96–112)
Creatinine, Ser: 0.9 mg/dL (ref 0.40–1.20)
GFR: 68.15 mL/min (ref >60.00)
Glucose, Bld: 90 mg/dL (ref 70–99)
Potassium: 3.7 meq/L (ref 3.5–5.1)
Sodium: 139 meq/L (ref 135–145)

## 2023-10-23 NOTE — Patient Instructions (Signed)
 An order for Zio patch was placed.  It is a heart monitor that you wear for several days.  You will receive this in the mail with the instructions on use.

## 2023-10-23 NOTE — Progress Notes (Signed)
 Established Patient Office Visit   Subjective  Patient ID: Evelyn Edwards, female    DOB: [birth year]  Age: 62 y.o. MRN: [id number]  Chief Complaint  Patient presents with   Medical Management of Chronic Issues    Hospital follow-up for Low thyroid  levels    Patient is a 63 year old female seen for ED follow-up.  Patient seen in ED on 10/21/2023 for tachycardia.  Chest x-ray and labs largely normal with the exception of low TSH.  Free T4 was not obtained.  Patient advised to follow-up with PCP.  Patient states he has been doing well since ED.  Had an episode of palpitations this morning that is since resolved.  Does not recall any changes in meds, foods that may have triggered symptoms.  Was at hospital Thursday night into Friday morning with her mother but unsure if that contributed.  Drinks 2 cups of coffee in the morning.    Patient Active Problem List   Diagnosis Date Noted   Chronic migraine without aura, with intractable migraine, so stated, with status migrainosus 09/27/2022   Acute bilateral low back pain 04/04/2020   Migraine without aura and without status migrainosus, not intractable 01/22/2020   Weight loss 07/04/2017   Benign paroxysmal positional vertigo 10/29/2014   BREAST MASS, RIGHT 05/28/2009   Headache 10/31/2007   Past Medical History:  Diagnosis Date   Anxiety    Headache(784.0)    Past Surgical History:  Procedure Laterality Date   ABDOMINAL HYSTERECTOMY  2003   ovaries were left intact   WRIST SURGERY     Social History   Tobacco Use   Smoking status: Never   Smokeless tobacco: Never  Vaping Use   Vaping status: Never Used  Substance Use Topics   Alcohol use: No   Drug use: No   Family History  Problem Relation Age of Onset   Asthma Mother    Hypertension Father    Diabetes Father    Coronary artery disease Father    Colon polyps Father    Prostate cancer Father    Lupus Sister    Migraines Sister    Anxiety disorder Other    Colon  cancer Neg Hx    Esophageal cancer Neg Hx    Stomach cancer Neg Hx    Rectal cancer Neg Hx    Allergies  Allergen Reactions   Amoxicillin  Hives and Other (See Comments)    Dizziness, fever tmax 101 F, erythema of lips and tongue, hives, sore throat, chills.   Penicillins     ROS Negative unless stated above    Objective:      BP 118/72 (BP Location: Left Arm, Patient Position: Sitting, Cuff Size: Normal)   Pulse 86   Temp 98.6 F (37 C) (Oral)   Ht 5\' 5"  (1.651 m)   Wt 149 lb 3.2 oz (67.7 kg)   SpO2 98%   BMI 24.83 kg/m  BP Readings from Last 3 Encounters:  10/23/23 118/72  10/21/23 (!) 135/55  07/20/23 112/68   Wt Readings from Last 3 Encounters:  10/23/23 149 lb 3.2 oz (67.7 kg)  10/21/23 150 lb 2.1 oz (68.1 kg)  07/20/23 150 lb 3.2 oz (68.1 kg)      Physical Exam Constitutional:      General: She is not in acute distress.    Appearance: Normal appearance.  HENT:     Head: Normocephalic and atraumatic.     Nose: Nose normal.     Mouth/Throat:  Mouth: Mucous membranes are moist.  Cardiovascular:     Rate and Rhythm: Normal rate and regular rhythm.     Heart sounds: Normal heart sounds. No murmur heard.    No gallop.  Pulmonary:     Effort: Pulmonary effort is normal. No respiratory distress.     Breath sounds: Normal breath sounds. No wheezing, rhonchi or rales.  Skin:    General: Skin is warm and dry.  Neurological:     Mental Status: She is alert and oriented to person, place, and time.        07/20/2023   10:46 AM 02/16/2023   11:17 AM 08/24/2022   10:29 AM  Depression screen PHQ 2/9  Decreased Interest 0 0 0  Down, Depressed, Hopeless 0 0 0  PHQ - 2 Score 0 0 0  Altered sleeping 0 0   Tired, decreased energy 0 0   Change in appetite 0 0   Feeling bad or failure about yourself  0 0   Trouble concentrating 0 0   Moving slowly or fidgety/restless 0 0   Suicidal thoughts 0 0   PHQ-9 Score 0 0   Difficult doing work/chores Not difficult  at all Not difficult at all       07/20/2023   10:47 AM 02/16/2023   11:18 AM 12/24/2020    8:20 AM  GAD 7 : Generalized Anxiety Score  Nervous, Anxious, on Edge 0 0 0  Control/stop worrying 0 0 0  Worry too much - different things 0 0 0  Trouble relaxing 0 0 0  Restless 0 0 0  Easily annoyed or irritable 0 0 0  Afraid - awful might happen 0 0 0  Total GAD 7 Score 0 0 0  Anxiety Difficulty Not difficult at all Not difficult at all      No results found for any visits on 10/23/23.    Assessment & Plan:   Palpitations -     T4, free -     T3 -     TSH -     LONG TERM MONITOR (3-14 DAYS); Future -     Basic metabolic panel with GFR  Low TSH level -     T4, free -     T3 -     TSH  AKI (acute kidney injury) (HCC) -     Basic metabolic panel with GFR  Labs, imaging, etc. from ED on 10/21/2023 reviewed.  Troponin mildly elevated at 29 and 28.  TSH low at 0.305.  Free T4 was not obtained.  Creatinine 1.02.  Baseline around 0.9.  Will recheck labs this visit.  Given concern for possible A-fib or other arrhythmia Zio patch ordered.  Patient to keep a diary of symptoms.  Patient given strict precautions.  Refer to cardiology if needed based on results of Zio patch.  Return if symptoms worsen or fail to improve.   Viola Greulich, MD

## 2023-10-23 NOTE — Progress Notes (Unsigned)
 EP to read.

## 2023-10-24 LAB — T4, FREE: Free T4: 0.69 ng/dL (ref 0.60–1.60)

## 2023-10-24 LAB — TSH: TSH: 0.4 u[IU]/mL (ref 0.35–5.50)

## 2023-10-24 LAB — T3: T3, Total: 126 ng/dL (ref 76–181)

## 2023-10-25 ENCOUNTER — Ambulatory Visit: Payer: Self-pay | Admitting: Family Medicine

## 2023-10-26 ENCOUNTER — Ambulatory Visit (INDEPENDENT_AMBULATORY_CARE_PROVIDER_SITE_OTHER)

## 2023-10-26 DIAGNOSIS — R002 Palpitations: Secondary | ICD-10-CM | POA: Diagnosis not present

## 2023-10-26 NOTE — Progress Notes (Signed)
 Patient is in office today for a nurse visit for Heart Monitor Placement.   Monitor was placed by Clinical Supervisor, Crissy. This cma observed. Pt was given instruction to monitor sx on a log and how send the monitor back after completion. Pt verbalized understanding.

## 2023-11-07 ENCOUNTER — Other Ambulatory Visit: Payer: Self-pay | Admitting: Family Medicine

## 2023-11-07 DIAGNOSIS — Z8669 Personal history of other diseases of the nervous system and sense organs: Secondary | ICD-10-CM

## 2023-11-09 DIAGNOSIS — R002 Palpitations: Secondary | ICD-10-CM

## 2023-11-17 ENCOUNTER — Other Ambulatory Visit: Payer: Self-pay | Admitting: Family Medicine

## 2023-11-17 DIAGNOSIS — Z8669 Personal history of other diseases of the nervous system and sense organs: Secondary | ICD-10-CM

## 2024-04-06 ENCOUNTER — Other Ambulatory Visit: Payer: Self-pay | Admitting: Family Medicine

## 2024-04-06 DIAGNOSIS — Z8669 Personal history of other diseases of the nervous system and sense organs: Secondary | ICD-10-CM

## 2024-04-11 ENCOUNTER — Ambulatory Visit: Payer: Commercial Managed Care - PPO | Admitting: Neurology

## 2024-05-16 ENCOUNTER — Other Ambulatory Visit: Payer: Self-pay | Admitting: Neurology

## 2024-05-16 DIAGNOSIS — G43711 Chronic migraine without aura, intractable, with status migrainosus: Secondary | ICD-10-CM

## 2024-05-20 MED ORDER — NARATRIPTAN HCL 2.5 MG PO TABS: 2.5000 mg | ORAL_TABLET | ORAL | 0 refills | Status: DC | PRN

## 2024-05-20 NOTE — Telephone Encounter (Signed)
 Pt called to request  Both medication to be fill ondansetron  (ZOFRAN -ODT) 4 MG disintegrating tablet  naratriptan  (AMERGE) 2.5 MG tablet  Pt medication is to be sent to    [id number] - [place], [place] - [address] Phone: [phone]  Fax: [phone]

## 2024-05-20 NOTE — Telephone Encounter (Signed)
 Pt scheduled on 08/15/24

## 2024-05-20 NOTE — Telephone Encounter (Signed)
 Last seen on 04/28/23  Pt is overdue for appointment please call to get scheduled.  Rx's approved.

## 2024-05-20 NOTE — Telephone Encounter (Signed)
 Pt has been scheduled.

## 2024-05-28 ENCOUNTER — Other Ambulatory Visit: Payer: Self-pay

## 2024-05-28 DIAGNOSIS — G43711 Chronic migraine without aura, intractable, with status migrainosus: Secondary | ICD-10-CM

## 2024-05-28 MED ORDER — NARATRIPTAN HCL 2.5 MG PO TABS: 2.5000 mg | ORAL_TABLET | ORAL | 0 refills | Status: AC | PRN

## 2024-06-20 ENCOUNTER — Telehealth: Payer: Self-pay | Admitting: Neurology

## 2024-06-20 NOTE — Telephone Encounter (Signed)
 Pt called stating that Pharmacy informed thet PA is needed for  Pt medication naratriptan  (AMERGE) 2.5 MG tablet

## 2024-06-21 ENCOUNTER — Other Ambulatory Visit (HOSPITAL_COMMUNITY): Payer: Self-pay

## 2024-06-21 ENCOUNTER — Telehealth: Payer: Self-pay

## 2024-06-21 NOTE — Telephone Encounter (Signed)
 Pharmacy Patient Advocate Encounter  Received notification from [facility] [employer] that Prior Authorization for Naratriptan  2.5mg   has been APPROVED from 06/21/2024 to 06/21/2025   PA #/Case ID/Reference #: [id number]

## 2024-06-21 NOTE — Telephone Encounter (Signed)
 Pharmacy Patient Advocate Encounter   Received notification from Pt Calls Messages that prior authorization for Naratriptan  HCl 2.5MG  tablets is required/requested.   Insurance verification completed.   The patient is insured through [employer].   Per test claim: PA required; PA submitted to above mentioned insurance via Latent Key/confirmation #/EOC [id number] Status is pending

## 2024-06-21 NOTE — Telephone Encounter (Signed)
 PA request has been Submitted. New Encounter has been or will be created for follow up. For additional info see Pharmacy Prior Auth telephone encounter from 06-21-2024.

## 2024-06-27 ENCOUNTER — Ambulatory Visit: Payer: Self-pay | Admitting: Family Medicine

## 2024-07-04 ENCOUNTER — Encounter: Payer: Self-pay | Admitting: Family Medicine

## 2024-07-11 ENCOUNTER — Ambulatory Visit

## 2024-07-11 ENCOUNTER — Encounter: Payer: Self-pay | Admitting: Family Medicine

## 2024-07-11 ENCOUNTER — Ambulatory Visit: Admitting: Family Medicine

## 2024-07-11 VITALS — BP 114/66 | HR 73 | Temp 98.6°F | Ht 65.0 in | Wt 150.2 lb

## 2024-07-11 DIAGNOSIS — Z0000 Encounter for general adult medical examination without abnormal findings: Secondary | ICD-10-CM

## 2024-07-11 DIAGNOSIS — E782 Mixed hyperlipidemia: Secondary | ICD-10-CM

## 2024-07-11 DIAGNOSIS — G8929 Other chronic pain: Secondary | ICD-10-CM | POA: Diagnosis not present

## 2024-07-11 DIAGNOSIS — M25561 Pain in right knee: Secondary | ICD-10-CM | POA: Diagnosis not present

## 2024-07-11 DIAGNOSIS — R7303 Prediabetes: Secondary | ICD-10-CM

## 2024-07-11 DIAGNOSIS — Z8669 Personal history of other diseases of the nervous system and sense organs: Secondary | ICD-10-CM

## 2024-07-11 DIAGNOSIS — Z1231 Encounter for screening mammogram for malignant neoplasm of breast: Secondary | ICD-10-CM

## 2024-07-11 LAB — CBC WITH DIFFERENTIAL/PLATELET
Basophils Absolute: 0 10*3/uL (ref 0.0–0.1)
Basophils Relative: 0.6 % (ref 0.0–3.0)
Eosinophils Absolute: 0 10*3/uL (ref 0.0–0.7)
Eosinophils Relative: 1.1 % (ref 0.0–5.0)
HCT: 41.3 % (ref 36.0–46.0)
Hemoglobin: 13.5 g/dL (ref 12.0–15.0)
Lymphocytes Relative: 38.7 % (ref 12.0–46.0)
Lymphs Abs: 1.6 10*3/uL (ref 0.7–4.0)
MCHC: 32.7 g/dL (ref 30.0–36.0)
MCV: 91.9 fl (ref 78.0–100.0)
Monocytes Absolute: 0.4 10*3/uL (ref 0.1–1.0)
Monocytes Relative: 8.9 % (ref 3.0–12.0)
Neutro Abs: 2 10*3/uL (ref 1.4–7.7)
Neutrophils Relative %: 50.7 % (ref 43.0–77.0)
Platelets: 227 10*3/uL (ref 150.0–400.0)
RBC: 4.49 Mil/uL (ref 3.87–5.11)
RDW: 13.4 % (ref 11.5–15.5)
WBC: 4 10*3/uL (ref 4.0–10.5)

## 2024-07-11 LAB — COMPREHENSIVE METABOLIC PANEL WITH GFR
ALT: 13 U/L (ref 3–35)
AST: 18 U/L (ref 5–37)
Albumin: 4.2 g/dL (ref 3.5–5.2)
Alkaline Phosphatase: 69 U/L (ref 39–117)
BUN: 12 mg/dL (ref 6–23)
CO2: 27 meq/L (ref 19–32)
Calcium: 9.4 mg/dL (ref 8.4–10.5)
Chloride: 110 meq/L (ref 96–112)
Creatinine, Ser: 0.93 mg/dL (ref 0.40–1.20)
GFR: 65.19 mL/min
Glucose, Bld: 94 mg/dL (ref 70–99)
Potassium: 3.7 meq/L (ref 3.5–5.1)
Sodium: 141 meq/L (ref 135–145)
Total Bilirubin: 0.3 mg/dL (ref 0.2–1.2)
Total Protein: 7.5 g/dL (ref 6.0–8.3)

## 2024-07-11 LAB — LIPID PANEL
Cholesterol: 177 mg/dL (ref 28–200)
HDL: 64.1 mg/dL
LDL Cholesterol: 95 mg/dL (ref 10–99)
NonHDL: 113.25
Total CHOL/HDL Ratio: 3
Triglycerides: 92 mg/dL (ref 10.0–149.0)
VLDL: 18.4 mg/dL (ref 0.0–40.0)

## 2024-07-11 LAB — T4, FREE: Free T4: 0.67 ng/dL (ref 0.60–1.60)

## 2024-07-11 LAB — TSH: TSH: 0.33 u[IU]/mL — ABNORMAL LOW (ref 0.35–5.50)

## 2024-07-11 LAB — HEMOGLOBIN A1C: Hgb A1c MFr Bld: 5.7 % (ref 4.6–6.5)

## 2024-07-11 MED ORDER — TRAMADOL HCL 50 MG PO TABS: ORAL_TABLET | ORAL | 1 refills | Status: AC

## 2024-07-11 NOTE — Progress Notes (Signed)
 "  Established Patient Office Visit   Subjective  Patient ID: Evelyn Edwards, female    DOB: [birth year]  Age: 64 y.o. MRN: [id number]  Chief Complaint  Patient presents with   Annual Exam    Pt is a 64 yo female seen for CPE.  Pt is not fasting.  States doing ok all things considered.  Endorses some stress caring for her mother and worrying about her daughter, Arville who is dealing with HAs due to IIH.  Pt noticing an increase in migraines.  Also with R knee pain and intermittent instability.  Had arthroscopic surgery on R knee several yrs ago.  Pain increasing.  Denies edema or new injury.  Pt needs to schedule mammogram.    Patient Active Problem List   Diagnosis Date Noted   Chronic migraine without aura, with intractable migraine, so stated, with status migrainosus 09/27/2022   Acute bilateral low back pain 04/04/2020   Migraine without aura and without status migrainosus, not intractable 01/22/2020   Weight loss 07/04/2017   Benign paroxysmal positional vertigo 10/29/2014   BREAST MASS, RIGHT 05/28/2009   Headache 10/31/2007   Past Medical History:  Diagnosis Date   Allergy 11/11/22   Allergic to penicillin   Anxiety    Headache(784.0)    Past Surgical History:  Procedure Laterality Date   ABDOMINAL HYSTERECTOMY  2003   ovaries were left intact   FRACTURE SURGERY  2010   Broken Wrist   WRIST SURGERY     Social History[1] Family History  Problem Relation Age of Onset   Asthma Mother    Kidney disease Mother    COPD Mother    Arthritis Mother    Hypertension Father    Diabetes Father    Coronary artery disease Father    Colon polyps Father    Prostate cancer Father    Lupus Sister    Migraines Sister    Anxiety disorder Other    Colon cancer Neg Hx    Esophageal cancer Neg Hx    Stomach cancer Neg Hx    Rectal cancer Neg Hx    Allergies[2]  ROS Negative unless stated above    Objective:     BP 114/66 (BP Location: Left Arm, Patient Position:  Sitting, Cuff Size: Large)   Pulse 73   Temp 98.6 F (37 C) (Oral)   Ht 5' 5 (1.651 m)   Wt 150 lb 3.2 oz (68.1 kg)   SpO2 97%   BMI 24.99 kg/m  BP Readings from Last 3 Encounters:  07/11/24 114/66  10/23/23 118/72  10/21/23 (!) 135/55   Wt Readings from Last 3 Encounters:  07/11/24 150 lb 3.2 oz (68.1 kg)  10/23/23 149 lb 3.2 oz (67.7 kg)  10/21/23 150 lb 2.1 oz (68.1 kg)      Physical Exam Constitutional:      Appearance: Normal appearance.  HENT:     Head: Normocephalic and atraumatic.     Right Ear: Tympanic membrane, ear canal and external ear normal.     Left Ear: Tympanic membrane, ear canal and external ear normal.     Nose: Nose normal.     Mouth/Throat:     Mouth: Mucous membranes are moist.     Pharynx: No oropharyngeal exudate or posterior oropharyngeal erythema.  Eyes:     General: No scleral icterus.    Extraocular Movements: Extraocular movements intact.     Conjunctiva/sclera: Conjunctivae normal.     Pupils: Pupils are equal, round, and  reactive to light.  Neck:     Thyroid : No thyromegaly.     Vascular: No carotid bruit.  Cardiovascular:     Rate and Rhythm: Normal rate and regular rhythm.     Pulses: Normal pulses.     Heart sounds: Normal heart sounds. No murmur heard.    No friction rub.  Pulmonary:     Effort: Pulmonary effort is normal.     Breath sounds: Normal breath sounds. No wheezing, rhonchi or rales.  Abdominal:     General: Bowel sounds are normal.     Palpations: Abdomen is soft.     Tenderness: There is no abdominal tenderness.  Musculoskeletal:        General: No deformity. Normal range of motion.     Right knee: No swelling, deformity or effusion. Tenderness present. Abnormal patellar mobility.     Left knee: Normal.       Legs:     Comments: Patellar tendon laxity, R patella with subluxation with bending knee.  No crepitus.  TTP of R knee inferior lateral to patella.  Lymphadenopathy:     Cervical: No cervical  adenopathy.  Skin:    General: Skin is warm and dry.     Findings: No lesion.  Neurological:     General: No focal deficit present.     Mental Status: She is alert and oriented to person, place, and time.  Psychiatric:        Mood and Affect: Mood normal.        Thought Content: Thought content normal.        07/11/2024   11:12 AM 07/20/2023   10:46 AM 02/16/2023   11:17 AM  Depression screen PHQ 2/9  Decreased Interest 0 0 0  Down, Depressed, Hopeless 0 0 0  PHQ - 2 Score 0 0 0  Altered sleeping 0 0 0  Tired, decreased energy 0 0 0  Change in appetite 0 0 0  Feeling bad or failure about yourself  0 0 0  Trouble concentrating 0 0 0  Moving slowly or fidgety/restless 0 0 0  Suicidal thoughts 0 0 0  PHQ-9 Score 0 0  0   Difficult doing work/chores Not difficult at all Not difficult at all Not difficult at all     Data saved with a previous flowsheet row definition      07/11/2024   11:13 AM 07/20/2023   10:47 AM 02/16/2023   11:18 AM 12/24/2020    8:20 AM  GAD 7 : Generalized Anxiety Score  Nervous, Anxious, on Edge 0 0  0  0   Control/stop worrying 0 0  0  0   Worry too much - different things 0 0  0  0   Trouble relaxing 0 0  0  0   Restless 0 0  0  0   Easily annoyed or irritable 0 0  0  0   Afraid - awful might happen 0 0  0  0   Total GAD 7 Score 0 0 0 0  Anxiety Difficulty Not difficult at all Not difficult at all Not difficult at all      Data saved with a previous flowsheet row definition     No results found for any visits on 07/11/24.    Assessment & Plan:   Well adult exam -     CBC with Differential/Platelet; Future -     Comprehensive metabolic panel with GFR; Future -  Hemoglobin A1c; Future -     Lipid panel; Future -     T4, free; Future -     TSH; Future  Prediabetes -     Hemoglobin A1c; Future  Mixed hyperlipidemia -     Lipid panel; Future  Encounter for screening mammogram for malignant neoplasm of breast -     3D Screening  Mammogram, Left and Right; Future  Chronic pain of right knee -     DG Knee Complete 4 Views Right; Future  History of migraine -     traMADol  HCl; TAKE 1 TABLET(50 MG) BY MOUTH EVERY 8 HOURS AS NEEDED  Dispense: 60 tablet; Refill: 1  Age appropriate health screenings discussed.  Obtain labs.  Last hgb A1c 5.9% on 02/16/2023.  Immunizations reviewed.  Consider influenza vaccine.  Mammogram ordered.  Pap not indicated 2/2 hysterectomy.  Colonoscopy done 03/01/22.  Increase knee pain.  Discussed possible causes including OA, patellar tendon laxity causing patellar instability, etc.  Obtain imaging.  Further recommendations based on results.  Continue supportive care including topical analgesics, heat, ice, compression, elevation, etc.  Follow-up as needed.  Return if symptoms worsen or fail to improve.  Next CPE in 1 year.  Clotilda JONELLE Single, MD     [1]  Social History Tobacco Use   Smoking status: Never   Smokeless tobacco: Never  Vaping Use   Vaping status: Never Used  Substance Use Topics   Alcohol use: Never   Drug use: Never  [2]  Allergies Allergen Reactions   Amoxicillin  Hives and Other (See Comments)    Dizziness, fever tmax 101 F, erythema of lips and tongue, hives, sore throat, chills.   Penicillins    "

## 2024-07-12 ENCOUNTER — Ambulatory Visit: Payer: Self-pay | Admitting: Family Medicine

## 2024-08-15 ENCOUNTER — Ambulatory Visit: Payer: Self-pay | Admitting: Neurology
